# Patient Record
Sex: Male | Born: 1935 | Race: White | Hispanic: No | Smoking: Never smoker
Health system: Southern US, Community
[De-identification: ages and names within clinical notes are randomized; demographics above are authoritative.]

## PROBLEM LIST (undated history)

## (undated) DIAGNOSIS — I2699 Other pulmonary embolism without acute cor pulmonale: Secondary | ICD-10-CM

## (undated) DIAGNOSIS — F32A Depression, unspecified: Secondary | ICD-10-CM

## (undated) DIAGNOSIS — E039 Hypothyroidism, unspecified: Secondary | ICD-10-CM

## (undated) DIAGNOSIS — I6529 Occlusion and stenosis of unspecified carotid artery: Secondary | ICD-10-CM

## (undated) DIAGNOSIS — F329 Major depressive disorder, single episode, unspecified: Secondary | ICD-10-CM

## (undated) DIAGNOSIS — Z951 Presence of aortocoronary bypass graft: Secondary | ICD-10-CM

## (undated) DIAGNOSIS — K2970 Gastritis, unspecified, without bleeding: Secondary | ICD-10-CM

## (undated) DIAGNOSIS — E119 Type 2 diabetes mellitus without complications: Secondary | ICD-10-CM

## (undated) DIAGNOSIS — I4891 Unspecified atrial fibrillation: Secondary | ICD-10-CM

## (undated) DIAGNOSIS — E871 Hypo-osmolality and hyponatremia: Secondary | ICD-10-CM

## (undated) DIAGNOSIS — I739 Peripheral vascular disease, unspecified: Secondary | ICD-10-CM

## (undated) DIAGNOSIS — D649 Anemia, unspecified: Secondary | ICD-10-CM

## (undated) DIAGNOSIS — I10 Essential (primary) hypertension: Secondary | ICD-10-CM

---

## 2013-02-23 ENCOUNTER — Encounter (HOSPITAL_COMMUNITY): Payer: Self-pay | Admitting: Physical Medicine and Rehabilitation

## 2013-02-23 ENCOUNTER — Emergency Department (HOSPITAL_COMMUNITY): Payer: Medicare Other

## 2013-02-23 ENCOUNTER — Inpatient Hospital Stay (HOSPITAL_COMMUNITY)
Admission: EM | Admit: 2013-02-23 | Discharge: 2013-03-01 | DRG: 293 | Disposition: A | Discharge status: Hospice | Payer: Medicare Other | Attending: Internal Medicine | Admitting: Internal Medicine

## 2013-02-23 DIAGNOSIS — I2510 Atherosclerotic heart disease of native coronary artery without angina pectoris: Secondary | ICD-10-CM | POA: Diagnosis present

## 2013-02-23 DIAGNOSIS — E039 Hypothyroidism, unspecified: Secondary | ICD-10-CM | POA: Diagnosis present

## 2013-02-23 DIAGNOSIS — Z86711 Personal history of pulmonary embolism: Secondary | ICD-10-CM

## 2013-02-23 DIAGNOSIS — N189 Chronic kidney disease, unspecified: Secondary | ICD-10-CM

## 2013-02-23 DIAGNOSIS — R579 Shock, unspecified: Secondary | ICD-10-CM | POA: Diagnosis present

## 2013-02-23 DIAGNOSIS — E119 Type 2 diabetes mellitus without complications: Secondary | ICD-10-CM | POA: Diagnosis present

## 2013-02-23 DIAGNOSIS — I4891 Unspecified atrial fibrillation: Secondary | ICD-10-CM | POA: Diagnosis present

## 2013-02-23 DIAGNOSIS — I959 Hypotension, unspecified: Secondary | ICD-10-CM

## 2013-02-23 DIAGNOSIS — R7989 Other specified abnormal findings of blood chemistry: Secondary | ICD-10-CM

## 2013-02-23 DIAGNOSIS — Z951 Presence of aortocoronary bypass graft: Secondary | ICD-10-CM

## 2013-02-23 DIAGNOSIS — I509 Heart failure, unspecified: Principal | ICD-10-CM | POA: Diagnosis present

## 2013-02-23 DIAGNOSIS — D649 Anemia, unspecified: Secondary | ICD-10-CM | POA: Diagnosis present

## 2013-02-23 DIAGNOSIS — Z954 Presence of other heart-valve replacement: Secondary | ICD-10-CM

## 2013-02-23 HISTORY — DX: Depression, unspecified: F32.A

## 2013-02-23 HISTORY — DX: Hypo-osmolality and hyponatremia: E87.1

## 2013-02-23 HISTORY — DX: Major depressive disorder, single episode, unspecified: F32.9

## 2013-02-23 HISTORY — DX: Anemia, unspecified: D64.9

## 2013-02-23 HISTORY — DX: Presence of aortocoronary bypass graft: Z95.1

## 2013-02-23 HISTORY — DX: Essential (primary) hypertension: I10

## 2013-02-23 HISTORY — DX: Hypothyroidism, unspecified: E03.9

## 2013-02-23 HISTORY — DX: Unspecified atrial fibrillation: I48.91

## 2013-02-23 HISTORY — DX: Peripheral vascular disease, unspecified: I73.9

## 2013-02-23 HISTORY — DX: Gastritis, unspecified, without bleeding: K29.70

## 2013-02-23 HISTORY — DX: Type 2 diabetes mellitus without complications: E11.9

## 2013-02-23 HISTORY — DX: Occlusion and stenosis of unspecified carotid artery: I65.29

## 2013-02-23 HISTORY — DX: Other pulmonary embolism without acute cor pulmonale: I26.99

## 2013-02-23 LAB — CBC WITH DIFFERENTIAL/PLATELET
Basophils Relative: 0 % (ref 0–1)
Eosinophils Absolute: 0.1 10*3/uL (ref 0.0–0.7)
HCT: 35.5 % — ABNORMAL LOW (ref 39.0–52.0)
Hemoglobin: 11.9 g/dL — ABNORMAL LOW (ref 13.0–17.0)
MCH: 30.1 pg (ref 26.0–34.0)
MCHC: 33.5 g/dL (ref 30.0–36.0)
MCV: 89.9 fL (ref 78.0–100.0)
Monocytes Absolute: 1.2 10*3/uL — ABNORMAL HIGH (ref 0.1–1.0)
Monocytes Relative: 20 % — ABNORMAL HIGH (ref 3–12)

## 2013-02-23 LAB — COMPREHENSIVE METABOLIC PANEL
ALT: 58 U/L — ABNORMAL HIGH (ref 0–53)
CO2: 24 mEq/L (ref 19–32)
Calcium: 8.6 mg/dL (ref 8.4–10.5)
Creatinine, Ser: 3.76 mg/dL — ABNORMAL HIGH (ref 0.50–1.35)
GFR calc Af Amer: 15 mL/min — ABNORMAL LOW (ref >90)
GFR calc non Af Amer: 13 mL/min — ABNORMAL LOW (ref >90)
Glucose, Bld: 76 mg/dL (ref 70–99)
Sodium: 134 mEq/L — ABNORMAL LOW (ref 135–145)

## 2013-02-23 LAB — LACTIC ACID, PLASMA: Lactic Acid, Venous: 1.1 mmol/L (ref 0.5–2.2)

## 2013-02-23 LAB — POCT I-STAT 3, ART BLOOD GAS (G3+)
Acid-base deficit: 2 mmol/L (ref 0.0–2.0)
Patient temperature: 98.1
TCO2: 24 mmol/L (ref 0–100)
pH, Arterial: 7.378 (ref 7.350–7.450)

## 2013-02-23 MED ORDER — LEVOTHYROXINE SODIUM 112 MCG PO TABS
112.0000 ug | ORAL_TABLET | Freq: Every day | ORAL | Status: DC
  Administered 2013-02-26 – 2013-03-01 (×4): 112 ug via ORAL
  Filled 2013-02-23 (×8): qty 1

## 2013-02-23 MED ORDER — SODIUM CHLORIDE 0.9 % IV SOLN
250.0000 mL | INTRAVENOUS | Status: DC | PRN
  Administered 2013-02-24: 250 mL via INTRAVENOUS

## 2013-02-23 MED ORDER — NEBIVOLOL HCL 5 MG PO TABS: 5.0000 mg | ORAL_TABLET | Freq: Every morning | ORAL | Status: DC

## 2013-02-23 MED ORDER — ASPIRIN EC 81 MG PO TBEC
81.0000 mg | DELAYED_RELEASE_TABLET | Freq: Every morning | ORAL | Status: DC
  Administered 2013-02-24 – 2013-03-01 (×6): 81 mg via ORAL
  Filled 2013-02-23 (×6): qty 1

## 2013-02-23 MED ORDER — SODIUM CHLORIDE 0.9 % IV SOLN
Freq: Once | INTRAVENOUS | Status: AC
  Administered 2013-02-23: via INTRAVENOUS

## 2013-02-23 MED ORDER — DOCUSATE SODIUM 100 MG PO CAPS
100.0000 mg | ORAL_CAPSULE | Freq: Two times a day (BID) | ORAL | Status: DC
  Administered 2013-02-24 – 2013-03-01 (×9): 100 mg via ORAL
  Filled 2013-02-23 (×11): qty 1

## 2013-02-23 MED ORDER — RANOLAZINE ER 500 MG PO TB12
500.0000 mg | ORAL_TABLET | Freq: Two times a day (BID) | ORAL | Status: DC
  Administered 2013-02-24 – 2013-03-01 (×8): 500 mg via ORAL
  Filled 2013-02-23 (×12): qty 1

## 2013-02-23 MED ORDER — NITROGLYCERIN 0.4 MG SL SUBL: 0.4000 mg | SUBLINGUAL_TABLET | SUBLINGUAL | Status: DC | PRN

## 2013-02-23 MED ORDER — FAMOTIDINE 20 MG PO TABS
20.0000 mg | ORAL_TABLET | Freq: Every morning | ORAL | Status: DC
  Administered 2013-02-24 – 2013-03-01 (×6): 20 mg via ORAL
  Filled 2013-02-23 (×8): qty 1

## 2013-02-23 MED ORDER — FOLIC ACID 1 MG PO TABS
2.0000 mg | ORAL_TABLET | Freq: Every morning | ORAL | Status: DC
  Administered 2013-02-24 – 2013-03-01 (×6): 2 mg via ORAL
  Filled 2013-02-23 (×6): qty 2

## 2013-02-23 MED ORDER — SODIUM CHLORIDE 0.9 % IV BOLUS (SEPSIS): 1000.0000 mL | Freq: Once | INTRAVENOUS | Status: AC

## 2013-02-23 MED ORDER — SODIUM CHLORIDE 0.9 % IV BOLUS (SEPSIS)
1000.0000 mL | Freq: Once | INTRAVENOUS | Status: AC
  Administered 2013-02-23 (×2): 1000 mL via INTRAVENOUS

## 2013-02-23 MED ORDER — ATORVASTATIN CALCIUM 80 MG PO TABS
80.0000 mg | ORAL_TABLET | Freq: Every evening | ORAL | Status: DC
  Administered 2013-02-24: 80 mg via ORAL
  Filled 2013-02-23 (×2): qty 1

## 2013-02-23 MED ORDER — FUROSEMIDE 10 MG/ML IJ SOLN: 40.0000 mg | Freq: Once | INTRAMUSCULAR | Status: DC

## 2013-02-23 NOTE — ED Notes (Signed)
Pt is alert and oriented to self.Family at bedside. Made EDP aware that pt Blood pressure has been in 80s and oxygen saturations have not been above 90

## 2013-02-23 NOTE — ED Notes (Signed)
Admitting at bedside 

## 2013-02-23 NOTE — ED Notes (Addendum)
Pt presents to department via [facility] from [facility] for evaluation of fall and headache. Pt had unwitnessed fall yesterday, tripped over laundry basket. Takes coumadin for afib. Facility staff noticed that he was "mumbling" earlier today. Upon arrival pt is alert and able to answer questions appropriately. 10/10 headache at the time, describes as "worse headache of his life." pinpoint pupils noted. 18g RAC. BP 80/40. CBG 119.

## 2013-02-23 NOTE — ED Notes (Signed)
Report given to [facility] 2300. RN had No questions or concerns at this time.

## 2013-02-23 NOTE — ED Notes (Signed)
Family Contact Information  Dylan Salinas [phone]) [phone] [facility]) [phone](Work) Follow Prompts  Dylan Salinas (Daughter)  [phone] (Cell) [phone] (Home)

## 2013-02-23 NOTE — Consult Note (Signed)
Reason for Consult: Abnormal troponin-I  Referring Physician: Dr. Manus Gunning  Primary Cardiologist: Dr. Retta Mac - [place]  Dylan Salinas is an 79 y.o. male.   HPI: 79 y/o male currently been seen in consultation with Dr. Manus Gunning for evaluation of abnormal troponin-I. Patient is somnolent and unable to give history. Thus, history was obtained from available family members. Patient has a PMH of CAD s/p single vessel CABG and Mechanical Aortic valve replacement ([employer]) in 1988 at [facility] (record not available). About 4 years ago, he has chest discomfort and cardiac cath performed in [facility] showed that his graft vessel was occluded and a stent was placed in the native artery that the graft vessel was previously supplying. Patient has been on Coumadin for anti-coagulation for mechanical Aortic vale and AFIB.    He presented to [facility] with complaints of headache s/p fall yesterday.  As part of his evaluation, troponin-I was obtained and was found to be 1.0 ng/ml. Patient denies any chest pain, diaphoresis, PND, orthopnea or palpitation. His systolic blood pressure is 88 mmHg, and his heart rate is 53 bpm on telemetry, and he is hypoxic.  His EKG showed AFIB (56 bpm), with RBBB and LAFB.  His serum creatinine is 3.7 and his INR is 3.9.  A CT of the head did not show any acute intracranial process.  Past Medical History  Diagnosis Date  . Atrial fibrillation   . Diabetes mellitus without complication   . Hypertension   . Anemia   . Depression   . PVD (peripheral vascular disease)   . Carotid stenosis   . Gastritis   . Hypothyroidism   . Hyponatremia   . Hx of CABG   . PE (pulmonary embolism)     History reviewed. No pertinent past surgical history.  History reviewed. No pertinent family history.  Social History:  reports that he has never smoked. He does not have any smokeless tobacco history on file. He reports that he does not drink alcohol or use illicit  drugs. Patient Lives in [facility] assisted living facility  Allergies:  Allergies  Allergen Reactions  . Cephalosporins     Noted on MAR  . Cinchona Extract     Noted on MAR  . Macrolides And Ketolides     Noted on MAR  . Methenamine     Noted on MAR  . Quinidine     Noted on MAR  . Salicylates     Noted on MAR    Medications:   1. Aspirin 81 mg qd 2. Lipitor 80 mg qhs 3. Bystolic 5 mg qd 4. Famotidine 20 mg qd 5. Folic Acid 1 mg qd 6. Glipizide 5 mg qd 7. Imdur 30 mg qd 8. Levothyroxine 112 microgram qd 9. Lisinopril 10 mg bid 10. Torsemide 40 mg qd 11. Zaroxolyn 5 mg qd 12. Ranexa ER 500 mg bid 13. Coumadin 2 mg qd alternating with 1 mg every 3rd day  Results for orders placed during the hospital encounter of 02/23/13 (from the past 48 hour(s))  TROPONIN I     Status: Abnormal   Collection Time    02/23/13  3:39 PM      Result Value Range   Troponin I 1.01 (*) <0.30 ng/mL   Comment:            Due to the release kinetics of cTnI,     a negative result within the first hours     of the onset of  symptoms does not rule out     myocardial infarction with certainty.     If myocardial infarction is still suspected,     repeat the test at appropriate intervals.     CRITICAL RESULT CALLED TO, READ BACK BY AND VERIFIED WITH:     [facility] RN 02/23/13 1724 WOOTEN,K  PRO B NATRIURETIC PEPTIDE     Status: Abnormal   Collection Time    02/23/13  3:39 PM      Result Value Range   Pro B Natriuretic peptide (BNP) 10881.0 (*) 0 - 450 pg/mL  CBC WITH DIFFERENTIAL     Status: Abnormal   Collection Time    02/23/13  3:44 PM      Result Value Range   WBC 5.8  4.0 - 10.5 K/uL   RBC 3.95 (*) 4.22 - 5.81 MIL/uL   Hemoglobin 11.9 (*) 13.0 - 17.0 g/dL   HCT 16.1 (*) 09.6 - 04.5 %   MCV 89.9  78.0 - 100.0 fL   MCH 30.1  26.0 - 34.0 pg   MCHC 33.5  30.0 - 36.0 g/dL   RDW 40.9 (*) 81.1 - 91.4 %   Platelets 151  150 - 400 K/uL   Neutrophils Relative 62  43 - 77 %   Neutro  Abs 3.6  1.7 - 7.7 K/uL   Lymphocytes Relative 16  12 - 46 %   Lymphs Abs 0.9  0.7 - 4.0 K/uL   Monocytes Relative 20 (*) 3 - 12 %   Monocytes Absolute 1.2 (*) 0.1 - 1.0 K/uL   Eosinophils Relative 2  0 - 5 %   Eosinophils Absolute 0.1  0.0 - 0.7 K/uL   Basophils Relative 0  0 - 1 %   Basophils Absolute 0.0  0.0 - 0.1 K/uL  PROTIME-INR     Status: Abnormal   Collection Time    02/23/13  3:44 PM      Result Value Range   Prothrombin Time 36.0 (*) 11.6 - 15.2 seconds   INR 3.91 (*) 0.00 - 1.49  COMPREHENSIVE METABOLIC PANEL     Status: Abnormal   Collection Time    02/23/13  3:44 PM      Result Value Range   Sodium 134 (*) 135 - 145 mEq/L   Potassium 4.3  3.5 - 5.1 mEq/L   Chloride 95 (*) 96 - 112 mEq/L   CO2 24  19 - 32 mEq/L   Glucose, Bld 76  70 - 99 mg/dL   BUN 88 (*) 6 - 23 mg/dL   Creatinine, Ser 7.82 (*) 0.50 - 1.35 mg/dL   Calcium 8.6  8.4 - 95.6 mg/dL   Total Protein 6.4  6.0 - 8.3 g/dL   Albumin 2.6 (*) 3.5 - 5.2 g/dL   AST 213 (*) 0 - 37 U/L   ALT 58 (*) 0 - 53 U/L   Alkaline Phosphatase 124 (*) 39 - 117 U/L   Total Bilirubin 2.6 (*) 0.3 - 1.2 mg/dL   GFR calc non Af Amer 13 (*) >90 mL/min   GFR calc Af Amer 15 (*) >90 mL/min   Comment:            The eGFR has been calculated     using the CKD EPI equation.     This calculation has not been     validated in all clinical     situations.     eGFR's persistently     <90 mL/min signify  possible Chronic Kidney Disease.  POCT I-STAT 3, BLOOD GAS (G3+)     Status: Abnormal   Collection Time    02/23/13  5:24 PM      Result Value Range   pH, Arterial 7.378  7.350 - 7.450   pCO2 arterial 38.9  35.0 - 45.0 mmHg   pO2, Arterial 49.0 (*) 80.0 - 100.0 mmHg   Bicarbonate 23.0  20.0 - 24.0 mEq/L   TCO2 24  0 - 100 mmol/L   O2 Saturation 84.0     Acid-base deficit 2.0  0.0 - 2.0 mmol/L   Patient temperature 98.1 F     Collection site RADIAL, ALLEN'S TEST ACCEPTABLE     Drawn by Operator     Sample type ARTERIAL       Dg Chest 2 View  02/23/2013  *RADIOLOGY REPORT*  Clinical Data: Hypoxia  CHEST - 2 VIEW  Comparison: None.  Findings: Lungs are essentially clear.  No focal consolidation. No pleural effusion or pneumothorax.  Nodular opacities overlying the bilateral lower lobes are favored to reflect chest leads.  Cardiomegaly.  Postsurgical changes related to prior CABG.  Mild degenerative changes of the visualized thoracolumbar spine.  IMPRESSION: No evidence of acute cardiopulmonary disease.   Original Report Authenticated By: Charline Bills, M.D.    Ct Head Wo Contrast  02/23/2013  *RADIOLOGY REPORT*  Clinical Data: Fall.  On Coumadin.  Headache.  CT HEAD WITHOUT CONTRAST  Technique:  Contiguous axial images were obtained from the base of the skull through the vertex without contrast.  Comparison: None  Findings: Moderate atrophy.  Chronic microvascular ischemic change in the white matter.  No acute infarct.  No intracranial hemorrhage or mass.  Negative for skull fracture.  Multiple gas bubbles are present in the soft tissues of the infratemporal fossa and in the soft tissues of the right scalp.  No obvious laceration is seen.  This may be venous air from IV insertion.  Does the patient have a right arm IV? There is calcification in the scalp bilaterally which may be arterial calcification.  IMPRESSION: Atrophy and chronic microvascular ischemia.  No acute intracranial abnormality.  Multiple gas bubbles in the soft tissues on the right, most likely introduced from a right arm IV.   Original Report Authenticated By: Janeece Riggers, M.D.     Review of Systems  Unable to perform ROS: mental acuity   Blood pressure 88/53, pulse 57, temperature 98.1 F (36.7 C), temperature source Oral, resp. rate 18, SpO2 87.00%. Physical Exam  Constitutional: He appears well-developed and well-nourished. No distress.  Somnolent but easily arousable  HENT:  Head: Normocephalic and atraumatic.  Eyes: EOM are normal. Right eye  exhibits no discharge. Left eye exhibits no discharge. No scleral icterus.  Pin point pupil bilaterally  Neck: Normal range of motion. Neck supple. No JVD present. No thyromegaly present.  Cardiovascular: Exam reveals no friction rub.   No murmur heard. Irregularly irregular rhythm with crisp click of metallic prosthetic aortic valve  Respiratory: No stridor. No respiratory distress. He has no wheezes. He has no rales.  GI: Soft. Bowel sounds are normal. He exhibits distension. There is no tenderness. There is no rebound.  Musculoskeletal: He exhibits no edema and no tenderness.  Neurological:  Somnolent but easily arousable  Skin: No rash noted. He is not diaphoretic. No erythema. No pallor.    Assessment/Plan:  1. Elevated Troponin-I 2. CAD s/p CABG x 1 s/p PCI about 4 years ago 3. Mechanical Aortic Valve (  st Jude) 4. AFIB with normal ventricular rate 5. Chronic Kidney Disease (stage 5, GFR 13) 6. NIDDM  Patient has elevated troponin-I, but it is not clear if his troponin are rising at this point in time.  He has CKD stage 5, and he is unable to clear troponin. Currently, his baseline troponin is unknown.  He currently does not have chest pain, and there is no definite ST-elevation on his EKG.  Elevated troponin could be due to demand ischemia from the stress of his current illness and relatively low blood pressure.  Given his co-morbidities, CKD, we will recommend medical therapy only.  He is already on Aspirin, beta-blockers and Lipitor.  We will recommend obtaining serial cardiac markers to see which way they are trending and to obtain a transthoracic echocardiogram in the morning to evaluate his systolic and diastolic function.  Since his blood pressure is on the low side, he may benefit from holding the evening dose of his Lisinopril to see if his blood pressure improves.  In addition, since he has supra-therapeutic INR and he is having headache s/p fall, we recommend holding his  Coumadin tonight and obtaining PT/INR tomorrow. He may also benefit from Holding Torsemide and Zaroxolyn since he has worsening renal function.  AITSEBAOMO, Dylan Salinas 02/23/2013, 7:43 PM

## 2013-02-23 NOTE — ED Notes (Signed)
Call [facility] to determine when they coming to assess and admit pt. Made EDP and family aware of their response. Will continue to monitor.

## 2013-02-23 NOTE — ED Provider Notes (Signed)
History     CSN: [id number]  Arrival date & time 02/23/13  1523   First MD Initiated Contact with Patient 02/23/13 1529      Chief Complaint  Patient presents with  . Fall  . Headache    (Consider location/radiation/quality/duration/timing/severity/associated sxs/prior treatment) HPI Comments: Patient presents via EMS with altered mental status for the past day. He apparently had an unwitnessed fall yesterday. He denies falling. He complains of a headache that started about 3 hours ago. Is diffuse in the top of his head is going to the back. He is somnolent but protecting his airway and answers questions appropriately. He is oriented x3. He is found to be hypotensive and hypoxic. He denies any chest pain or difficulty breathing. He's on Coumadin for history of A. Fib, PE, mechanical aortic valve.  The history is provided by the patient and the EMS personnel. The history is limited by the condition of the patient.    Past Medical History  Diagnosis Date  . Atrial fibrillation   . Diabetes mellitus without complication   . Hypertension   . Anemia   . Depression   . PVD (peripheral vascular disease)   . Carotid stenosis   . Gastritis   . Hypothyroidism   . Hyponatremia   . Hx of CABG   . PE (pulmonary embolism)     History reviewed. No pertinent past surgical history.  History reviewed. No pertinent family history.  History  Substance Use Topics  . Smoking status: Never Smoker   . Smokeless tobacco: Not on file  . Alcohol Use: No      Review of Systems  Unable to perform ROS: Mental status change  Neurological: Positive for headaches.    Allergies  Cephalosporins; Cinchona extract; Macrolides and ketolides; Methenamine; Quinidine; and Salicylates  Home Medications   No current outpatient prescriptions on file.  BP 109/54  Pulse 68  Temp(Src) 96.8 F (36 C) (Core (Comment))  Resp 18  Ht 5\' 8"  (1.727 m)  Wt 187 lb 6.3 oz (85 kg)  BMI 28.5 kg/m2  SpO2  95%  Physical Exam  Constitutional: He is oriented to person, place, and time. He appears well-developed and well-nourished. No distress.  Somnolent, but arousable, protecting airway, answers questions appropriately  HENT:  Head: Normocephalic and atraumatic.  Mouth/Throat: Oropharynx is clear and moist. No oropharyngeal exudate.  Eyes: Conjunctivae and EOM are normal. Pupils are equal, round, and reactive to light.  Pinpoint pupils bilaterally  Neck: Normal range of motion. Neck supple.  Cardiovascular: Normal rate and normal heart sounds.   No murmur heard. Irregular bradycardic rhythm  Pulmonary/Chest: Effort normal and breath sounds normal. No respiratory distress.  Bibasilar crackles  Abdominal: Soft. There is no tenderness. There is no rebound and no guarding.  Musculoskeletal: Normal range of motion. He exhibits no edema and no tenderness.  Neurological: He is alert and oriented to person, place, and time. No cranial nerve deficit. He exhibits normal muscle tone. Coordination normal.   Equal grip strength bilaterally, moving all extremities  Skin: Skin is warm.    ED Course  Procedures (including critical care time)  Labs Reviewed  CBC WITH DIFFERENTIAL - Abnormal; Notable for the following:    RBC 3.95 (*)    Hemoglobin 11.9 (*)    HCT 35.5 (*)    RDW 17.6 (*)    Monocytes Relative 20 (*)    Monocytes Absolute 1.2 (*)    All other components within normal limits  PROTIME-INR -  Abnormal; Notable for the following:    Prothrombin Time 36.0 (*)    INR 3.91 (*)    All other components within normal limits  COMPREHENSIVE METABOLIC PANEL - Abnormal; Notable for the following:    Sodium 134 (*)    Chloride 95 (*)    BUN 88 (*)    Creatinine, Ser 3.76 (*)    Albumin 2.6 (*)    AST 117 (*)    ALT 58 (*)    Alkaline Phosphatase 124 (*)    Total Bilirubin 2.6 (*)    GFR calc non Af Amer 13 (*)    GFR calc Af Amer 15 (*)    All other components within normal limits   TROPONIN I - Abnormal; Notable for the following:    Troponin I 1.01 (*)    All other components within normal limits  PRO B NATRIURETIC PEPTIDE - Abnormal; Notable for the following:    Pro B Natriuretic peptide (BNP) 10881.0 (*)    All other components within normal limits  CBC - Abnormal; Notable for the following:    RDW 17.6 (*)    All other components within normal limits  BASIC METABOLIC PANEL - Abnormal; Notable for the following:    Glucose, Bld 45 (*)    BUN 90 (*)    Creatinine, Ser 3.86 (*)    GFR calc non Af Amer 13 (*)    GFR calc Af Amer 14 (*)    All other components within normal limits  MAGNESIUM - Abnormal; Notable for the following:    Magnesium 2.6 (*)    All other components within normal limits  PROTIME-INR - Abnormal; Notable for the following:    Prothrombin Time 39.4 (*)    INR 4.42 (*)    All other components within normal limits  TROPONIN I - Abnormal; Notable for the following:    Troponin I 1.11 (*)    All other components within normal limits  TROPONIN I - Abnormal; Notable for the following:    Troponin I 1.81 (*)    All other components within normal limits  GLUCOSE, CAPILLARY - Abnormal; Notable for the following:    Glucose-Capillary 43 (*)    All other components within normal limits  GLUCOSE, CAPILLARY - Abnormal; Notable for the following:    Glucose-Capillary 60 (*)    All other components within normal limits  GLUCOSE, CAPILLARY - Abnormal; Notable for the following:    Glucose-Capillary 109 (*)    All other components within normal limits  POCT I-STAT 3, BLOOD GAS (G3+) - Abnormal; Notable for the following:    pO2, Arterial 49.0 (*)    All other components within normal limits  POCT I-STAT 3, BLOOD GAS (G3+) - Abnormal; Notable for the following:    pO2, Arterial 76.0 (*)    Acid-base deficit 3.0 (*)    All other components within normal limits  MRSA PCR SCREENING  MRSA PCR SCREENING  LACTIC ACID, PLASMA  GLUCOSE, CAPILLARY   CARBOXYHEMOGLOBIN  PROCALCITONIN  GLUCOSE, CAPILLARY  BLOOD GAS, ARTERIAL  HEMOGLOBIN A1C  TROPONIN I  CORTISOL   Dg Chest 2 View  02/23/2013  *RADIOLOGY REPORT*  Clinical Data: Hypoxia  CHEST - 2 VIEW  Comparison: None.  Findings: Lungs are essentially clear.  No focal consolidation. No pleural effusion or pneumothorax.  Nodular opacities overlying the bilateral lower lobes are favored to reflect chest leads.  Cardiomegaly.  Postsurgical changes related to prior CABG.  Mild degenerative changes of  the visualized thoracolumbar spine.  IMPRESSION: No evidence of acute cardiopulmonary disease.   Original Report Authenticated By: Charline Bills, M.D.    Ct Head Wo Contrast  02/23/2013  *RADIOLOGY REPORT*  Clinical Data: Fall.  On Coumadin.  Headache.  CT HEAD WITHOUT CONTRAST  Technique:  Contiguous axial images were obtained from the base of the skull through the vertex without contrast.  Comparison: None  Findings: Moderate atrophy.  Chronic microvascular ischemic change in the white matter.  No acute infarct.  No intracranial hemorrhage or mass.  Negative for skull fracture.  Multiple gas bubbles are present in the soft tissues of the infratemporal fossa and in the soft tissues of the right scalp.  No obvious laceration is seen.  This may be venous air from IV insertion.  Does the patient have a right arm IV? There is calcification in the scalp bilaterally which may be arterial calcification.  IMPRESSION: Atrophy and chronic microvascular ischemia.  No acute intracranial abnormality.  Multiple gas bubbles in the soft tissues on the right, most likely introduced from a right arm IV.   Original Report Authenticated By: Janeece Riggers, M.D.    Dg Chest Port 1 View  02/24/2013  *RADIOLOGY REPORT*  Clinical Data: Central line placement.  PORTABLE CHEST - 1 VIEW  Comparison: Chest radiograph performed the 2014  Findings: The patient's right IJ line appears to end within the right atrium; this should be  retracted 3-4 cm to the cavoatrial junction.  Mild left basilar opacity may reflect atelectasis or possibly pneumonia.  No pleural effusion or pneumothorax is seen.  The lungs are relatively well expanded.  Scattered calcified granulomata are suspected.  The cardiomediastinal silhouette is mildly enlarged.  The patient is status post median sternotomy, with evidence of prior CABG. Calcification is noted in the aortic arch.  No acute osseous abnormalities are seen.  IMPRESSION:  1.  Right IJ line appears to end within the right atrium; this should be retracted 3-4 cm to the cavoatrial junction. 2.  Mild left basilar airspace opacity may reflect atelectasis or possibly pneumonia. 3.  Mild cardiomegaly.  These results were called by telephone on 02/24/2013 at 01:58 a.m. to [facility] on [id number], who verbally acknowledged these results.   Original Report Authenticated By: Tonia Ghent, M.D.      1. Headache   2. NSTEMI (non-ST elevated myocardial infarction)   3. Acute on chronic renal failure   4. Shock circulatory   5. Hypotension       MDM  Patient complains of headache that onset around 12 PM today. He had an unwitnessed fall yesterday falling over a laundry basket. At his facility today he was noted to be mumbling in complaining of a headache that onset suddenly. He is on Coumadin for history of atrial fibrillation, PE a mechanical aortic valve.  Hypotensive and hypoxic on arrival.  Bibasilar crackles. No peripheral edema.  Will gently hydrate.  Troponin elevated at 1. Patient denies any chest pain or shortness of breath. EKG shows slow A. fib. Discussed with on-call cardiologist. Patient's sees a cardiologist in [facility] has had a bypass in the past with multiple stents. Cardiology will consult. Medical management planned.  Interstitial edema on CXR.  Patient requiring NRB to maintain oxygen saturations. No improvement in BP with IVF.  BNP elevated. Comfortable on NRB. Hold diuresis in  setting of hypotension.  CT negative for hemorrhage.  Patient states headache was sudden onset.  Unable to perform LP given poor vital signs  and elevated INR.  PCCM to admit.  D/w patient and family who desire full code.    Date: 02/23/2013  Rate: 56  Rhythm: atrial fibrillation  QRS Axis: normal  Intervals: normal  ST/T Wave abnormalities: nonspecific ST/T changes  Conduction Disutrbances:right bundle branch block and left anterior fascicular block  Narrative Interpretation:   Old EKG Reviewed: none available  CRITICAL CARE Performed by: Glynn Octave   Total critical care time: 45  Critical care time was exclusive of separately billable procedures and treating other patients.  Critical care was necessary to treat or prevent imminent or life-threatening deterioration.  Critical care was time spent personally by me on the following activities: development of treatment plan with patient and/or surrogate as well as nursing, discussions with consultants, evaluation of patient's response to treatment, examination of patient, obtaining history from patient or surrogate, ordering and performing treatments and interventions, ordering and review of laboratory studies, ordering and review of radiographic studies, pulse oximetry and re-evaluation of patient's condition.   Glynn Octave, MD 02/24/13 1230

## 2013-02-24 ENCOUNTER — Inpatient Hospital Stay (HOSPITAL_COMMUNITY): Payer: Medicare Other

## 2013-02-24 DIAGNOSIS — R51 Headache: Secondary | ICD-10-CM

## 2013-02-24 DIAGNOSIS — R579 Shock, unspecified: Secondary | ICD-10-CM | POA: Diagnosis present

## 2013-02-24 LAB — BASIC METABOLIC PANEL
GFR calc Af Amer: 14 mL/min — ABNORMAL LOW (ref >90)
GFR calc non Af Amer: 13 mL/min — ABNORMAL LOW (ref >90)
Glucose, Bld: 45 mg/dL — ABNORMAL LOW (ref 70–99)
Potassium: 4.3 mEq/L (ref 3.5–5.1)
Sodium: 135 mEq/L (ref 135–145)

## 2013-02-24 LAB — GLUCOSE, CAPILLARY
Glucose-Capillary: 90 mg/dL (ref 70–99)
Glucose-Capillary: 109 mg/dL — ABNORMAL HIGH (ref 70–99)
Glucose-Capillary: 60 mg/dL — ABNORMAL LOW (ref 70–99)
Glucose-Capillary: 73 mg/dL (ref 70–99)

## 2013-02-24 LAB — POCT I-STAT 3, ART BLOOD GAS (G3+)
Acid-base deficit: 3 mmol/L — ABNORMAL HIGH (ref 0.0–2.0)
Bicarbonate: 22.9 mEq/L (ref 20.0–24.0)
O2 Saturation: 96 %
Patient temperature: 94.9
TCO2: 24 mmol/L (ref 0–100)
pCO2 arterial: 38.6 mmHg (ref 35.0–45.0)
pH, Arterial: 7.372 (ref 7.350–7.450)
pO2, Arterial: 76 mmHg — ABNORMAL LOW (ref 80.0–100.0)

## 2013-02-24 LAB — PROCALCITONIN: Procalcitonin: 0.32 ng/mL

## 2013-02-24 LAB — CBC
Hemoglobin: 13.8 g/dL (ref 13.0–17.0)
MCHC: 34.4 g/dL (ref 30.0–36.0)
WBC: 6.8 10*3/uL (ref 4.0–10.5)

## 2013-02-24 LAB — TROPONIN I
Troponin I: 1.81 ng/mL (ref <0.30)
Troponin I: 1.44 ng/mL (ref <0.30)

## 2013-02-24 LAB — PROTIME-INR
INR: 4.42 — ABNORMAL HIGH (ref 0.00–1.49)
Prothrombin Time: 39.4 seconds — ABNORMAL HIGH (ref 11.6–15.2)

## 2013-02-24 LAB — MRSA PCR SCREENING: MRSA by PCR: NEGATIVE

## 2013-02-24 LAB — CARBOXYHEMOGLOBIN: Carboxyhemoglobin: 1.2 % (ref 0.5–1.5)

## 2013-02-24 MED ORDER — DEXTROSE 50 % IV SOLN
25.0000 mL | Freq: Once | INTRAVENOUS | Status: AC | PRN
  Administered 2013-02-24: 25 mL via INTRAVENOUS

## 2013-02-24 MED ORDER — SODIUM CHLORIDE 0.9 % IJ SOLN: 10.0000 mL | INTRAMUSCULAR | Status: DC | PRN

## 2013-02-24 MED ORDER — NEBIVOLOL HCL 2.5 MG PO TABS
2.5000 mg | ORAL_TABLET | Freq: Every morning | ORAL | Status: DC
  Administered 2013-02-26 – 2013-03-01 (×4): 2.5 mg via ORAL
  Filled 2013-02-24 (×6): qty 1

## 2013-02-24 MED ORDER — DOPAMINE-DEXTROSE 3.2-5 MG/ML-% IV SOLN
2.0000 ug/kg/min | INTRAVENOUS | Status: DC
  Administered 2013-02-24: 4 ug/kg/min via INTRAVENOUS
  Administered 2013-02-24: 5 ug/kg/min via INTRAVENOUS
  Filled 2013-02-24 (×2): qty 250

## 2013-02-24 MED ORDER — DEXTROSE 50 % IV SOLN
25.0000 mL | Freq: Once | INTRAVENOUS | Status: AC
  Administered 2013-02-24: 25 mL via INTRAVENOUS

## 2013-02-24 MED ORDER — INSULIN ASPART 100 UNIT/ML ~~LOC~~ SOLN
0.0000 [IU] | SUBCUTANEOUS | Status: DC
  Administered 2013-02-25: 1 [IU] via SUBCUTANEOUS
  Administered 2013-02-25: 2 [IU] via SUBCUTANEOUS
  Administered 2013-02-25: 1 [IU] via SUBCUTANEOUS
  Administered 2013-02-25: 2 [IU] via SUBCUTANEOUS
  Administered 2013-02-25 – 2013-02-26 (×4): 1 [IU] via SUBCUTANEOUS
  Administered 2013-02-26 (×3): 2 [IU] via SUBCUTANEOUS
  Administered 2013-02-27 (×3): 1 [IU] via SUBCUTANEOUS
  Administered 2013-02-27: 2 [IU] via SUBCUTANEOUS
  Administered 2013-02-28 (×2): 1 [IU] via SUBCUTANEOUS
  Administered 2013-02-28: 2 [IU] via SUBCUTANEOUS
  Administered 2013-02-28: 1 [IU] via SUBCUTANEOUS

## 2013-02-24 MED ORDER — DEXTROSE 50 % IV SOLN
INTRAVENOUS | Status: AC
  Administered 2013-02-24: 50 mL
  Filled 2013-02-24: qty 50

## 2013-02-24 MED ORDER — DEXTROSE 50 % IV SOLN
INTRAVENOUS | Status: AC
  Filled 2013-02-24: qty 50

## 2013-02-24 MED ORDER — HYDROCORTISONE SOD SUCCINATE 100 MG IJ SOLR
50.0000 mg | Freq: Four times a day (QID) | INTRAMUSCULAR | Status: DC
  Administered 2013-02-24 – 2013-02-25 (×6): 50 mg via INTRAVENOUS
  Filled 2013-02-24 (×8): qty 1

## 2013-02-24 MED ORDER — CHLORHEXIDINE GLUCONATE 0.12 % MT SOLN
15.0000 mL | Freq: Two times a day (BID) | OROMUCOSAL | Status: DC
  Administered 2013-02-24 – 2013-02-27 (×6): 15 mL via OROMUCOSAL
  Filled 2013-02-24 (×6): qty 15

## 2013-02-24 MED ORDER — FUROSEMIDE 10 MG/ML IJ SOLN
120.0000 mg | Freq: Once | INTRAVENOUS | Status: AC
  Administered 2013-02-24: 120 mg via INTRAVENOUS
  Filled 2013-02-24: qty 12

## 2013-02-24 MED ORDER — SODIUM CHLORIDE 0.9 % IJ SOLN
10.0000 mL | Freq: Two times a day (BID) | INTRAMUSCULAR | Status: DC
  Administered 2013-02-24: 20 mL
  Administered 2013-02-24 – 2013-02-27 (×5): 10 mL

## 2013-02-24 MED ORDER — BIOTENE DRY MOUTH MT LIQD
15.0000 mL | Freq: Two times a day (BID) | OROMUCOSAL | Status: DC
  Administered 2013-02-24 – 2013-02-27 (×7): 15 mL via OROMUCOSAL

## 2013-02-24 NOTE — Progress Notes (Signed)
  Echocardiogram 2D Echocardiogram has been performed.  Dylan Salinas, Dylan Salinas 02/24/2013, 9:28 AM

## 2013-02-24 NOTE — Progress Notes (Signed)
Hypoglycemic Event  CBG 1149: 59    Treatment: 25mL IV D50  Symptoms: none  Follow-up CBG: Time 1220 CBG Result:80  Possible Reasons for Event:inadequate intake, patient NPO  Comments/MD notified hypoglycemia protocol initiated     Dylan Salinas  Remember to initiate Hypoglycemia Order Set & complete

## 2013-02-24 NOTE — Plan of Care (Signed)
Problem: Phase I Progression Outcomes Goal: OOB as tolerated unless otherwise ordered Outcome: Not Progressing Unable due to weakness and altered mental status    Goal: Voiding-avoid urinary catheter unless indicated Outcome: Not Progressing Altered mental status

## 2013-02-24 NOTE — Progress Notes (Signed)
Name: Dylan Salinas MRN: [id number] DOB: 1920-11-03    LOS: 1  Referring Provider:  Dr. Manus Gunning Reason for Referral:  Hypotension  PULMONARY / CRITICAL CARE MEDICINE  BRIEF: Dylan Salinas is a pleasant 79 year old man with past medical history of coronary artery disease, congestive heart failure, atrial fibrillation, aortic valve replacement, diabetes mellitus, and pulmonary embolism who was brought to the most common emergency room from his assisted living facility with increased fatigue and recent fall.  His son and daughter-in-law reports that for the last month to 6 weeks he has had progressive fatigue and weakness.  The day prior to admission he fell at his assisted living facility and they were concerned that because he is on Coumadin for his atrial fibrillation he might have hit his head and had a hemorrhage.  In ED he was found to be hypotensive and hypoxic, as well as having an elevated troponin and creatinine.  He was given 1 L normal saline with no significant improvement in his blood pressure.  [facility]. was called on 02/23/2013  for admission to treat his hypotension, hypoxia and multiple comorbidities.  Events Since Admission: 02/23/2013  3:23 PM > Admit. Central line placed, started on dopamine   SUBJECTIVE/OVERNIGHT/INTERVAL HX  02/24/2013 : Continues to be on dopamine 15 mcg per minute. CVP 17. He is awake alert calm  Vital Signs: Temp:  [94.9 F (34.9 C)-98.1 F (36.7 C)] 96.8 F (36 C) (03/09 0800) Pulse Rate:  [48-74] 69 (03/09 0900) Resp:  [12-23] 17 (03/09 0900) BP: (68-123)/(38-77) 123/61 mmHg (03/09 0900) SpO2:  [79 %-100 %] 98 % (03/09 0900) FiO2 (%):  [100 %] 100 % (03/09 0800) Weight:  [85 kg (187 lb 6.3 oz)] 85 kg (187 lb 6.3 oz) (03/08 2340)  Physical Examination: General:  Elderly male, laying in bed, no acute distress. Looks very deconditioned Neuro:  RASS sedation score 0. CAM-ICU negative for delirium  HEENT:  Pupils equally round reactive to light, extraocular  muscles intact OP clear Neck:  Supple, jugular venous distention to the earlobe at a 30 angle Cardiovascular: Heart rate 70 on dopamine, irregularly irregular, systolic click heard at left upper sternal border, 2/6 systolic murmur Lungs:  Bilateral basilar crackles, no wheezes, good air movement throughout Abdomen:  Distended, soft, nontender, positive bowel sounds Musculoskeletal:  No joint abnormalities, 1+ edema in bilateral lower extremities, clubbing Skin:  Skin changes consistent with chronic venous stasis in lower extremities, multiple ulcers on anterior lower extremities   IMAGING x 48h Dg Chest 2 View  02/23/2013  *RADIOLOGY REPORT*  Clinical Data: Hypoxia  CHEST - 2 VIEW  Comparison: None.  Findings: Lungs are essentially clear.  No focal consolidation. No pleural effusion or pneumothorax.  Nodular opacities overlying the bilateral lower lobes are favored to reflect chest leads.  Cardiomegaly.  Postsurgical changes related to prior CABG.  Mild degenerative changes of the visualized thoracolumbar spine.  IMPRESSION: No evidence of acute cardiopulmonary disease.   Original Report Authenticated By: Charline Bills, M.D.    Ct Head Wo Contrast  02/23/2013  *RADIOLOGY REPORT*  Clinical Data: Fall.  On Coumadin.  Headache.  CT HEAD WITHOUT CONTRAST  Technique:  Contiguous axial images were obtained from the base of the skull through the vertex without contrast.  Comparison: None  Findings: Moderate atrophy.  Chronic microvascular ischemic change in the white matter.  No acute infarct.  No intracranial hemorrhage or mass.  Negative for skull fracture.  Multiple gas bubbles are present in the soft tissues of  the infratemporal fossa and in the soft tissues of the right scalp.  No obvious laceration is seen.  This may be venous air from IV insertion.  Does the patient have a right arm IV? There is calcification in the scalp bilaterally which may be arterial calcification.  IMPRESSION: Atrophy and  chronic microvascular ischemia.  No acute intracranial abnormality.  Multiple gas bubbles in the soft tissues on the right, most likely introduced from a right arm IV.   Original Report Authenticated By: Janeece Riggers, M.D.    Dg Chest Port 1 View  02/24/2013  *RADIOLOGY REPORT*  Clinical Data: Central line placement.  PORTABLE CHEST - 1 VIEW  Comparison: Chest radiograph performed the 2014  Findings: The patient's right IJ line appears to end within the right atrium; this should be retracted 3-4 cm to the cavoatrial junction.  Mild left basilar opacity may reflect atelectasis or possibly pneumonia.  No pleural effusion or pneumothorax is seen.  The lungs are relatively well expanded.  Scattered calcified granulomata are suspected.  The cardiomediastinal silhouette is mildly enlarged.  The patient is status post median sternotomy, with evidence of prior CABG. Calcification is noted in the aortic arch.  No acute osseous abnormalities are seen.  IMPRESSION:  1.  Right IJ line appears to end within the right atrium; this should be retracted 3-4 cm to the cavoatrial junction. 2.  Mild left basilar airspace opacity may reflect atelectasis or possibly pneumonia. 3.  Mild cardiomegaly.  These results were called by telephone on 02/24/2013 at 01:58 a.m. to [facility] on [phone], who verbally acknowledged these results.   Original Report Authenticated By: Tonia Ghent, M.D.      Principal Problem:   Hypotension Active Problems:   Hypoxia   CHF (congestive heart failure)   CKD (chronic kidney disease)   ASSESSMENT AND PLAN  PULMONARY  Recent Labs Lab 02/23/13 1724 02/24/13 0014  PHART 7.378 7.372  PCO2ART 38.9 38.6  PO2ART 49.0* 76.0*  HCO3 23.0 22.9  O2SAT 84.0 96.0    A:  Hypoxic respiratory failure likely secondary to pulmonary edema from decompensated heart failure -  02/24/2013: He is on nasal cannula oxygen and maintaining normal work of breathing P:    nasal  oxygen    CARDIOVASCULAR  Recent Labs Lab 02/23/13 1539 02/23/13 1831 02/24/13 0515  TROPONINI 1.01*  --  1.11*  LATICACIDVEN  --  1.1  --   PROBNP 10881.0*  --   --    No results found for this basename: PROCALCITON,  in the last 168 hours  ECG:  Bradycardic, atrial fibrillation, wide QRS complex Lines: Right IJ placed 02/24/2013  A: Hypotension likely secondary to decompensated heart failure and bradycardia - 02/24/2013: CVP 17, BNP is 10,000 and troponins are mildly positive.  Maintaining vital signs on dopamine. Does a lot of conflicting data on etiology of shock. Differential diagnoses include sepsis, cardiac, adrenal insufficiency, medication induced due to antihypertensives. Currently appears well volume resuscitated   P:  - Maintain dopamine  for systolic greater than 90 and map greater than 55 - Check [facility] VO2   - Check echocardiogram - Check cortisol and start empiric hydrocortisone - Check pro-calcitonin algorithm    RENAL  Recent Labs Lab 02/23/13 1544 02/24/13 0426  NA 134* 135  K 4.3 4.3  CL 95* 97  CO2 24 23  BUN 88* 90*  CREATININE 3.76* 3.86*  CALCIUM 8.6 8.6  MG  --  2.6*   Intake/Output  03/08 0701 - 03/09 0700 03/09 0701 - 03/10 0700   I.V. (mL/kg) 1152 (13.6) 91.7 (1.1)   IV Piggyback 62    Total Intake(mL/kg) 1214 (14.3) 91.7 (1.1)   Urine (mL/kg/hr) 360 75 (0.3)   Total Output 360 75   Net +854 +16.7           A:  Likely acute on chronic renal failure baseline creatinine unknown  - 02/24/2013: Foley catheter is leaking. Creatinine is unchanged P:   DISCONTINUE FOLEY CATHETER  Follow daily BMP Follow urine output  GASTROINTESTINAL  Recent Labs Lab 02/23/13 1544  AST 117*  ALT 58*  ALKPHOS 124*  BILITOT 2.6*  PROT 6.4  ALBUMIN 2.6*    Recent Labs Lab 02/23/13 1544 02/24/13 0426  INR 3.91* 4.42*     A:  Liver disease/mild transaminitis. Reported history of recent diagnosis of hepatitis A not otherwise  specified  P:    obtain liver ultrasound; pending reportedly ordered by admitting physician  Consider hepatology consult  HEMATOLOGIC  Recent Labs Lab 02/23/13 1544 02/24/13 0426  HGB 11.9* 13.8  HCT 35.5* 40.1  PLT 151 159  INR 3.91* 4.42*   A:  Anticoagulated for history of atrial fibrillation and mechanical valve - 02/24/2013: INR 4.4 no evidence of obvious bleeding  P:  Supratherapeutic INR Hold Coumadin until INR less than 2.5 Depending on medical course at that point either start heparin GGT or restart Coumadin   INFECTIOUS  Recent Labs Lab 02/23/13 1544 02/24/13 0426  WBC 5.8 6.8  No results found for this basename: PROCALCITON,  in the last 168 hours  Cultures: None Antibiotics: None  A:  No obvious signs or source of infection P:   Monitor for signs of infectionbut we'll check pro-calcitonin algorithm   ENDOCRINE  Recent Labs Lab 02/24/13 0342 02/24/13 0418 02/24/13 0732  GLUCAP 43* 90 60*   A:  Diabetes   P:   Controlled on oral hypoglycemics Hold orals and place patient on sliding scale insulin  NEUROLOGIC  A:  Altered mental status. Head CT in ER at admission negative for acute processes   - 02/24/2013: He is awake and alert  P - Monitor     BEST PRACTICE / DISPOSITION Level of Care:  ICU Primary Service:  PC CM Consultants:  Cardiology Code Status:  Full Diet:  N.p.o. DVT Px:  Anticoagulated GI Px:  Pepcid Skin Integrity:  Lesions on anterior shins Social / Family:  NO ily at bedside  I spent of critical care time in the care of this patient appeared her procedures which are documented elsewhere    Dr. Kalman Shan, M.D., [facility].C.P Pulmonary and Critical Care Medicine Staff Physician [facility] [place] Pulmonary and Critical Care Pager: [phone], If no answer or between  15:00h - 7:00h: call [phone]  02/24/2013 10:24 AM

## 2013-02-24 NOTE — Progress Notes (Signed)
Hypoglycemic Event  CBG: 60  Treatment:D50 IV 25mL Symptoms: none  Follow-up CBG: Time 0800 CBG Result:109  Possible Reasons for Event: inadequate meal intake Comments/MD notified:hypoglycemia protocol     Dylan Salinas  Remember to initiate Hypoglycemia Order Set & complete

## 2013-02-24 NOTE — H&P (Signed)
Name: Dylan Salinas MRN: [id number] DOB: [birth year]    LOS: 1  Referring Dylan Salinas:  Dr. Manus Salinas Reason for Referral:  Hypotension  PULMONARY / CRITICAL CARE MEDICINE  HPI:  Mr. Dylan Salinas is a pleasant 79 year old man with past medical history of coronary artery disease, congestive heart failure, atrial fibrillation, aortic valve replacement, diabetes mellitus, and pulmonary embolism who was brought to the most common emergency room from his assisted living facility with increased fatigue and recent fall.  His son and daughter-in-law reports that for the last month to 6 weeks he has had progressive fatigue and weakness.  The day prior to admission he fell at his assisted living facility and they were concerned that because he is on Coumadin for his atrial fibrillation he might have hit his head and had a hemorrhage.  In ED he was found to be hypotensive and hypoxic, as well as having an elevated troponin and creatinine.  He was given 1 L normal saline with no significant improvement in his blood pressure.  [facility] M. was called for admission to tear his hypotension, hypoxia and multiple comorbidities.  Past Medical History  Diagnosis Date  . Atrial fibrillation   . Diabetes mellitus without complication   . Hypertension   . Anemia   . Depression   . PVD (peripheral vascular disease)   . Carotid stenosis   . Gastritis   . Hypothyroidism   . Hyponatremia   . Hx of CABG   . PE (pulmonary embolism)    History reviewed. No pertinent past surgical history. Prior to Admission medications   Medication Sig Start Date End Date Taking? Authorizing Carmella Kees  aspirin EC 81 MG tablet Take 81 mg by mouth every morning.   Yes Historical Annisha Baar, MD  atorvastatin (LIPITOR) 80 MG tablet Take 80 mg by mouth every evening.   Yes Historical Argelia Formisano, MD  docusate sodium (COLACE) 100 MG capsule Take 100 mg by mouth 2 (two) times daily.   Yes Historical Harry Bark, MD  famotidine (PEPCID) 20 MG tablet Take 20 mg by  mouth every morning.   Yes Historical Nadirah Socorro, MD  folic acid (FOLVITE) 1 MG tablet Take 2 mg by mouth every morning.   Yes Historical Tiahna Cure, MD  glipiZIDE (GLUCOTROL) 5 MG tablet Take 5 mg by mouth every morning.   Yes Historical Deona Novitski, MD  Iron-DSS-B12-FA-C-E-Cu-Biotin (HEMAX PO) Take 1 tablet by mouth 2 (two) times daily.   Yes Historical Greogry Goodwyn, MD  isosorbide mononitrate (IMDUR) 30 MG 24 hr tablet Take 30 mg by mouth every morning.   Yes Historical Tunya Held, MD  levothyroxine (SYNTHROID, LEVOTHROID) 112 MCG tablet Take 112 mcg by mouth every morning.   Yes Historical Charmine Bockrath, MD  lisinopril (PRINIVIL,ZESTRIL) 10 MG tablet Take 10-20 mg by mouth 2 (two) times daily. 2 tablets (20mg ) in the morning 1 tablet (10mg ) in the evening   Yes Historical Skarlette Lattner, MD  metolazone (ZAROXOLYN) 5 MG tablet Take 5 mg by mouth every morning.   Yes Historical Gaelen Brager, MD  moxifloxacin (AVELOX) 400 MG tablet Take 400 mg by mouth every morning. For 7 days Started 3/5   Yes Historical Jarmaine Ehrler, MD  Multiple Vitamin (MULTIVITAMIN WITH MINERALS) TABS Take 1 tablet by mouth daily.   Yes Historical Waldine Zenz, MD  nebivolol (BYSTOLIC) 5 MG tablet Take 5 mg by mouth every morning.   Yes Historical Jontrell Bushong, MD  nitroGLYCERIN (NITROSTAT) 0.4 MG SL tablet Place 0.4 mg under the tongue every 5 (five) minutes x 3 doses as needed for chest pain.  Yes Historical Ketzaly Cardella, MD  Omega-3 Fatty Acids (SEA-OMEGA 30 PO) Take 1 capsule by mouth 2 (two) times daily.   Yes Historical Katena Petitjean, MD  ranolazine (RANEXA) 500 MG 12 hr tablet Take 500 mg by mouth 2 (two) times daily.   Yes Historical Aiyah Scarpelli, MD  torsemide (DEMADEX) 20 MG tablet Take 20-40 mg by mouth 2 (two) times daily. 2 tablets (40mg ) in the morning 1 tablet (20mg ) at [facility].   Yes Historical Bevin Mayall, MD  traMADol (ULTRAM) 50 MG tablet Take 50 mg by mouth every 6 (six) hours as needed for pain.   Yes Historical Briellah Baik, MD  Warfarin 2mg  daily alternating with  3 mg every 3rd day  Allergies Allergies  Allergen Reactions  . Cephalosporins     Noted on MAR  . Cinchona Extract     Noted on MAR  . Macrolides And Ketolides     Noted on MAR  . Methenamine     Noted on MAR  . Quinidine     Noted on MAR  . Salicylates     Noted on MAR    Family History Family history reviewed and negative for CHF Social History  reports that he has never smoked. He does not have any smokeless tobacco history on file. He reports that he does not drink alcohol or use illicit drugs.  Review Of Systems:  A full review of systems was obtained and was negative except as stated in the HPI  Brief patient description:  79 year old man with multiple medical problems including CHF, CAD, and chronic kidney disease, diabetes, admitted for hypotension that appears to be due to decompensated heart failure.  Events Since Admission: Central line placed, started on dopamine  Current Status: critical  Vital Signs: Temp:  [94.9 F (34.9 C)-98.1 F (36.7 C)] 94.9 F (34.9 C) (03/09 0000) Pulse Rate:  [48-58] 55 (03/09 0345) Resp:  [12-21] 18 (03/09 0345) BP: (68-111)/(38-77) 96/47 mmHg (03/09 0345) SpO2:  [79 %-100 %] 96 % (03/09 0345) Weight:  [85 kg (187 lb 6.3 oz)] 85 kg (187 lb 6.3 oz) (03/08 2340)  Physical Examination: General:  Elderly male, laying in bed, no acute distress Neuro:  Opens eyes to tactile stimuli, does not answer questions, not oriented to person place or time HEENT:  Pupils equally round reactive to light, extraocular muscles intact OP clear Neck:  Supple, jugular venous distention to the earlobe at a 30 angle Cardiovascular:  Bradycardic, irregularly irregular, systolic click heard at left upper sternal border, 2/6 systolic murmur Lungs:  Bilateral basilar crackles, no wheezes, good air movement throughout Abdomen:  Distended, soft, nontender, positive bowel sounds Musculoskeletal:  No joint abnormalities, 1+ edema in bilateral lower  extremities, clubbing Skin:  Skin changes consistent with chronic venous stasis in lower extremities, multiple ulcers on anterior lower extremities  Principal Problem:   Hypotension Active Problems:   Hypoxia   CHF (congestive heart failure)   CKD (chronic kidney disease)   ASSESSMENT AND PLAN  PULMONARY  Recent Labs Lab 02/23/13 1724 02/24/13 0014  PHART 7.378 7.372  PCO2ART 38.9 38.6  PO2ART 49.0* 76.0*  HCO3 23.0 22.9  O2SAT 84.0 96.0   Ventilator Settings:   CXR:  Increased vascular markings, no obvious infiltrate ETT:  None  A:  Hypoxic respiratory failure likely secondary to pulmonary edema from decompensated heart failure P:   Patient stable right now on a nonrebreather, if requires increased oxygen we'll attempt CPAP   CARDIOVASCULAR  Recent Labs Lab 02/23/13 1539 02/23/13 1831  TROPONINI 1.01*  --   LATICACIDVEN  --  1.1  PROBNP 10881.0*  --    ECG:  Bradycardic, atrial fibrillation, wide QRS complex Lines: Right IJ placed 02/24/2013  A: Hypotension likely secondary to decompensated heart failure and bradycardia P:  BNP elevated at 10,000 Patient initiated on dopamine Will attempt to diurese once blood pressure improved on pressors Follow urine output closely  RENAL  Recent Labs Lab 02/23/13 1544  NA 134*  K 4.3  CL 95*  CO2 24  BUN 88*  CREATININE 3.76*  CALCIUM 8.6   Intake/Output     03/08 0701 - 03/09 0700   I.V. (mL/kg) 1031.3 (12.1)   Total Intake(mL/kg) 1031.3 (12.1)   Urine (mL/kg/hr) 310   Total Output 310   Net +721.3        Foley:  Placed 02/24/2013  A:  Likely acute on chronic renal failure P:   Patient with chronic renal failure baseline creatinine is unknown Follow daily BMP Follow urine output  GASTROINTESTINAL  Recent Labs Lab 02/23/13 1544  AST 117*  ALT 58*  ALKPHOS 124*  BILITOT 2.6*  PROT 6.4  ALBUMIN 2.6*    A:  Liver disease P:   Per patient's family he was recently diagnosed with  hepatitis A Unfortunately 2 to patient's Coumadin tonight his INR to monitor synthetic function Will obtain liver ultrasound Consider hepatology consult  HEMATOLOGIC  Recent Labs Lab 02/23/13 1544  HGB 11.9*  HCT 35.5*  PLT 151  INR 3.91*   A:  Anticoagulated for history of atrial fibrillation and mechanical valve P:  Supratherapeutic INR Hold Coumadin until INR less than 2.5 Depending on medical course at that point either start heparin GGT or restart Coumadin   INFECTIOUS  Recent Labs Lab 02/23/13 1544  WBC 5.8   Cultures: None Antibiotics: None  A:  No obvious signs or source of infection P:   Monitor for signs of infection  ENDOCRINE No results found for this basename: GLUCAP,  in the last 168 hours A:  Diabetes   P:   Controlled on oral hypoglycemics Hold orals and place patient on sliding scale insulin  NEUROLOGIC  A:  Altered mental status P:   Likely secondary to decompensated heart failure Head CT done in ER was negative for any acute process  BEST PRACTICE / DISPOSITION Level of Care:  ICU Primary Service:  PC CM Consultants:  Cardiology Code Status:  Full Diet:  N.p.o. DVT Px:  Anticoagulated GI Px:  Pepcid Skin Integrity:  Lesions on anterior shins Social / Family:  Family at bedside  I spent 45 minutes of critical care time in the care of this patient appeared her procedures which are documented elsewhere  Carolan Clines., M.D. Pulmonary and Critical Care Medicine [facility] Pager: [phone]    02/23/2013, 10:13 PM

## 2013-02-24 NOTE — Procedures (Signed)
Central Venous Catheter Insertion Procedure Note Dylan Salinas [id number] [birth year]  Procedure: Insertion of Central Venous Catheter Indications: Assessment of intravascular volume, Drug and/or fluid administration and Frequent blood sampling  Procedure Details Consent: Risks of procedure as well as the alternatives and risks of each were explained to the (patient/caregiver).  Consent for procedure obtained. Time Out: Verified patient identification, verified procedure, site/side was marked, verified correct patient position, special equipment/implants available, medications/allergies/relevent history reviewed, required imaging and test results available.  Performed  Maximum sterile technique was used including antiseptics, cap, gloves, gown, hand hygiene, mask and sheet. Skin prep: Chlorhexidine; local anesthetic administered A antimicrobial bonded/coated triple lumen catheter was placed in the right internal jugular vein using the Seldinger technique.  Evaluation Blood flow good Complications: No apparent complications Patient did tolerate procedure well. Chest X-Gurman ordered to verify placement.  CXR: normal.  GIDDINGS, [name] 02/24/2013, 02:00 AM

## 2013-02-24 NOTE — Progress Notes (Addendum)
Subjective:  Patient has remained stable overnight.  No chest pain or dyspnea. Rhythm stable chronic atrial fibrillation. States his headache is better.  Objective:  Vital Signs in the last 24 hours: Temp:  [94.9 F (34.9 C)-98.1 F (36.7 C)] 96.8 F (36 C) (03/09 0800) Pulse Rate:  [48-74] 69 (03/09 0900) Resp:  [12-23] 17 (03/09 0900) BP: (68-123)/(38-77) 123/61 mmHg (03/09 0900) SpO2:  [79 %-100 %] 98 % (03/09 0900) FiO2 (%):  [100 %] 100 % (03/09 0800) Weight:  [187 lb 6.3 oz (85 kg)] 187 lb 6.3 oz (85 kg) (03/08 2340)  Intake/Output from previous day: 03/08 0701 - 03/09 0700 In: 1214 [I.V.:1152; IV Piggyback:62] Out: 360 [Urine:360] Intake/Output from this shift: Total I/O In: 91.7 [I.V.:91.7] Out: 75 [Urine:75]  . antiseptic oral rinse  15 mL Mouth Rinse q12n4p  . aspirin EC  81 mg Oral q morning - 10a  . atorvastatin  80 mg Oral QPM  . chlorhexidine  15 mL Mouth Rinse BID  . dextrose  25 mL Intravenous Once  . docusate sodium  100 mg Oral BID  . famotidine  20 mg Oral q morning - 10a  . folic acid  2 mg Oral q morning - 10a  . hydrocortisone sod succinate (SOLU-CORTEF) injection  50 mg Intravenous Q6H  . insulin aspart  0-9 Units Subcutaneous Q4H  . levothyroxine  112 mcg Oral QAC breakfast  . nebivolol  2.5 mg Oral q morning - 10a  . ranolazine  500 mg Oral BID  . sodium chloride  10-40 mL Intracatheter Q12H   . DOPamine 10 mcg/kg/min (02/24/13 1100)    Physical Exam: The patient appears to be in no distress. The patient appears to be in no distress.  Head and neck exam reveals that the pupils are equal and reactive.  The extraocular movements are full.  There is no scleral icterus.  Mouth and pharynx are benign.  No lymphadenopathy.  No carotid bruits.  The jugular venous pressure is normal.  Thyroid is not enlarged or tender.  Chest is clear to percussion and auscultation.  No rales or rhonchi.  Expansion of the chest is symmetrical.  Heart reveals  good mechanical aortic valve clicks.  The abdomen is soft and nontender.  Hypoactive bowel sounds  Extremities reveal no phlebitis or edema.  Pedal pulses are good.  There is no cyanosis or clubbing.  Neurologic exam is normal strength and no lateralizing weakness.  No sensory deficits.  Integument reveals no rash   Lab Results:  Recent Labs  02/23/13 1544 02/24/13 0426  WBC 5.8 6.8  HGB 11.9* 13.8  PLT 151 159    Recent Labs  02/23/13 1544 02/24/13 0426  NA 134* 135  K 4.3 4.3  CL 95* 97  CO2 24 23  GLUCOSE 76 45*  BUN 88* 90*  CREATININE 3.76* 3.86*    Recent Labs  02/23/13 1539 02/24/13 0515  TROPONINI 1.01* 1.11*   Hepatic Function Panel  Recent Labs  02/23/13 1544  PROT 6.4  ALBUMIN 2.6*  AST 117*  ALT 58*  ALKPHOS 124*  BILITOT 2.6*   No results found for this basename: CHOL,  in the last 72 hours No results found for this basename: PROTIME,  in the last 72 hours  Imaging: Dg Chest 2 View  02/23/2013  *RADIOLOGY REPORT*  Clinical Data: Hypoxia  CHEST - 2 VIEW  Comparison: None.  Findings: Lungs are essentially clear.  No focal consolidation. No pleural effusion or pneumothorax.  Nodular opacities overlying the bilateral lower lobes are favored to reflect chest leads.  Cardiomegaly.  Postsurgical changes related to prior CABG.  Mild degenerative changes of the visualized thoracolumbar spine.  IMPRESSION: No evidence of acute cardiopulmonary disease.   Original Report Authenticated By: Charline Bills, M.D.    Ct Head Wo Contrast  02/23/2013  *RADIOLOGY REPORT*  Clinical Data: Fall.  On Coumadin.  Headache.  CT HEAD WITHOUT CONTRAST  Technique:  Contiguous axial images were obtained from the base of the skull through the vertex without contrast.  Comparison: None  Findings: Moderate atrophy.  Chronic microvascular ischemic change in the white matter.  No acute infarct.  No intracranial hemorrhage or mass.  Negative for skull fracture.  Multiple gas  bubbles are present in the soft tissues of the infratemporal fossa and in the soft tissues of the right scalp.  No obvious laceration is seen.  This may be venous air from IV insertion.  Does the patient have a right arm IV? There is calcification in the scalp bilaterally which may be arterial calcification.  IMPRESSION: Atrophy and chronic microvascular ischemia.  No acute intracranial abnormality.  Multiple gas bubbles in the soft tissues on the right, most likely introduced from a right arm IV.   Original Report Authenticated By: Janeece Riggers, M.D.    Dg Chest Port 1 View  02/24/2013  *RADIOLOGY REPORT*  Clinical Data: Central line placement.  PORTABLE CHEST - 1 VIEW  Comparison: Chest radiograph performed the 2014  Findings: The patient's right IJ line appears to end within the right atrium; this should be retracted 3-4 cm to the cavoatrial junction.  Mild left basilar opacity may reflect atelectasis or possibly pneumonia.  No pleural effusion or pneumothorax is seen.  The lungs are relatively well expanded.  Scattered calcified granulomata are suspected.  The cardiomediastinal silhouette is mildly enlarged.  The patient is status post median sternotomy, with evidence of prior CABG. Calcification is noted in the aortic arch.  No acute osseous abnormalities are seen.  IMPRESSION:  1.  Right IJ line appears to end within the right atrium; this should be retracted 3-4 cm to the cavoatrial junction. 2.  Mild left basilar airspace opacity may reflect atelectasis or possibly pneumonia. 3.  Mild cardiomegaly.  These results were called by telephone on 02/24/2013 at 01:58 a.m. to [facility] on [id number], who verbally acknowledged these results.   Original Report Authenticated By: Tonia Ghent, M.D.     Cardiac Studies: Telemetry shows atrial fib with controlled ventricular response. Assessment/Plan:  1. Elevated Troponin-I  Still trending up slightly. Echo shows RV volume overload.  The RV pressure is ~normal.    He may need a VQ scan to look for pulmonary embolus.  Although, I would suspect his PA pressures would be higher in the setting of a pulmonary embolus   2. CAD s/p CABG x 1 s/p PCI about 4 years ago  3. Mechanical Aortic Valve ([employer] Jude)  INR supratherapeutic. 4. AFIB with normal ventricular rate  5. Chronic Kidney Disease (stage 5, GFR 13) , creatinine is slightly higher today 6. NIDDM     LOS: 1 day    Cassell Clement 02/24/2013, 11:15 AM

## 2013-02-24 NOTE — Progress Notes (Signed)
Hypoglycemic Event  CBG:43  Treatment: D50 IV 25 mL  Symptoms: None  Follow-up CBG: Time: 0515 CBG Result: 90  Possible Reasons for Event: Unknown  Comments/MD notified: Dr. Kendrick Fries aware    Dylan Salinas, Dylan Salinas  Remember to initiate Hypoglycemia Order Set & complete

## 2013-02-25 LAB — CBC WITH DIFFERENTIAL/PLATELET
Basophils Relative: 0 % (ref 0–1)
Eosinophils Absolute: 0 10*3/uL (ref 0.0–0.7)
Eosinophils Relative: 0 % (ref 0–5)
HCT: 35.5 % — ABNORMAL LOW (ref 39.0–52.0)
Hemoglobin: 12.3 g/dL — ABNORMAL LOW (ref 13.0–17.0)
Lymphs Abs: 0.5 10*3/uL — ABNORMAL LOW (ref 0.7–4.0)
MCH: 30.4 pg (ref 26.0–34.0)
MCHC: 34.6 g/dL (ref 30.0–36.0)
MCV: 87.7 fL (ref 78.0–100.0)
Monocytes Absolute: 0.2 10*3/uL (ref 0.1–1.0)
Monocytes Relative: 3 % (ref 3–12)
Neutrophils Relative %: 89 % — ABNORMAL HIGH (ref 43–77)
RBC: 4.05 MIL/uL — ABNORMAL LOW (ref 4.22–5.81)

## 2013-02-25 LAB — GLUCOSE, CAPILLARY
Glucose-Capillary: 141 mg/dL — ABNORMAL HIGH (ref 70–99)
Glucose-Capillary: 145 mg/dL — ABNORMAL HIGH (ref 70–99)
Glucose-Capillary: 189 mg/dL — ABNORMAL HIGH (ref 70–99)

## 2013-02-25 LAB — BASIC METABOLIC PANEL
BUN: 100 mg/dL — ABNORMAL HIGH (ref 6–23)
CO2: 21 mEq/L (ref 19–32)
GFR calc non Af Amer: 11 mL/min — ABNORMAL LOW (ref >90)
Glucose, Bld: 189 mg/dL — ABNORMAL HIGH (ref 70–99)
Potassium: 4.7 mEq/L (ref 3.5–5.1)

## 2013-02-25 LAB — HEMOGLOBIN A1C
Hgb A1c MFr Bld: 5.8 % — ABNORMAL HIGH (ref <5.7)
Mean Plasma Glucose: 120 mg/dL — ABNORMAL HIGH (ref <117)

## 2013-02-25 MED ORDER — HYDROCORTISONE SOD SUCCINATE 100 MG IJ SOLR
50.0000 mg | Freq: Four times a day (QID) | INTRAMUSCULAR | Status: DC
  Administered 2013-02-25 – 2013-02-27 (×8): 50 mg via INTRAVENOUS
  Filled 2013-02-25 (×12): qty 1

## 2013-02-25 MED ORDER — ALBUMIN HUMAN 5 % IV SOLN
12.5000 g | Freq: Two times a day (BID) | INTRAVENOUS | Status: DC
  Administered 2013-02-25 – 2013-02-27 (×4): 12.5 g via INTRAVENOUS
  Filled 2013-02-25 (×8): qty 250

## 2013-02-25 NOTE — Progress Notes (Signed)
Name: Dylan Salinas MRN: [id number] DOB: 11/03/1920    LOS: 2  Referring Shamyah Stantz:  Dr. Manus Gunning Reason for Referral:  Hypotension  PULMONARY / CRITICAL CARE MEDICINE  BRIEF: Dylan Salinas is a pleasant 79 year old man with past medical history of coronary artery disease, congestive heart failure, atrial fibrillation, aortic valve replacement, diabetes mellitus, and pulmonary embolism admitted for unclear etiology shock.  Events Since Admission: 02/23/2013  3:23 PM > Admit. Central line placed, started on dopamine 3/8 ct head neg, bubbles in soft tissue from IV [place] abdo 3/8- Hepatic cirrhosis. No focal hepatic parenchymal abnormalities Moderate ascites. Cholelithiasis. Contracted gallbladder. No sonographic  evidence of acute cholecystitis.  Approximate 8 cm simple cyst arising from the lower pole of theleft kidney. 3/9 remains on dopmaine  SUBJECTIVE/OVERNIGHT/INTERVAL HX Remains on pressors Hungry  Vital Signs: Temp:  [97.3 F (36.3 C)-98.6 F (37 C)] 97.3 F (36.3 C) (03/10 1150) Pulse Rate:  [51-69] 61 (03/10 1200) Resp:  [13-23] 13 (03/10 1200) BP: (87-111)/(40-63) 108/55 mmHg (03/10 1200) SpO2:  [89 %-99 %] 95 % (03/10 1200) Weight:  [86.7 kg (191 lb 2.2 oz)] 86.7 kg (191 lb 2.2 oz) (03/10 0400)  Physical Examination: General: awake, laert Neuro:nonfocal, gen weakness HEENT: jvd wnl PULM: coarse CV: s1 s2 borderline brady GI: soft, bs wnl, no  Extremities: no edema  IMAGING x 48h Dg Chest 2 View  02/23/2013  *RADIOLOGY REPORT*  Clinical Data: Hypoxia  CHEST - 2 VIEW  Comparison: None.  Findings: Lungs are essentially clear.  No focal consolidation. No pleural effusion or pneumothorax.  Nodular opacities overlying the bilateral lower lobes are favored to reflect chest leads.  Cardiomegaly.  Postsurgical changes related to prior CABG.  Mild degenerative changes of the visualized thoracolumbar spine.  IMPRESSION: No evidence of acute cardiopulmonary disease.   Original Report  Authenticated By: Charline Bills, M.D.    Ct Head Wo Contrast  02/23/2013  *RADIOLOGY REPORT*  Clinical Data: Fall.  On Coumadin.  Headache.  CT HEAD WITHOUT CONTRAST  Technique:  Contiguous axial images were obtained from the base of the skull through the vertex without contrast.  Comparison: None  Findings: Moderate atrophy.  Chronic microvascular ischemic change in the white matter.  No acute infarct.  No intracranial hemorrhage or mass.  Negative for skull fracture.  Multiple gas bubbles are present in the soft tissues of the infratemporal fossa and in the soft tissues of the right scalp.  No obvious laceration is seen.  This may be venous air from IV insertion.  Does the patient have a right arm IV? There is calcification in the scalp bilaterally which may be arterial calcification.  IMPRESSION: Atrophy and chronic microvascular ischemia.  No acute intracranial abnormality.  Multiple gas bubbles in the soft tissues on the right, most likely introduced from a right arm IV.   Original Report Authenticated By: Janeece Riggers, M.D.    US Abdomen Complete  02/25/2013  *RADIOLOGY REPORT*  Clinical Data:  Recent diagnosis of hepatitis A, now with liver failure.  Current history of hypertension, diabetes, and chronic kidney disease.  COMPLETE ABDOMINAL ULTRASOUND  Comparison:  None.  Findings:  Gallbladder:  Contracted containing numerous shadowing gallstones. Borderline gallbladder wall thickening at 3 mm.  No pericholecystic fluid.  Negative sonographic Murphy's sign according to the ultrasound technologist.  Common bile duct:  Normal in caliber with maximum diameter approximating 3 mm.  Liver:  Contracted with irregular contour and coarse, echogenic parenchyma.  No focal hepatic parenchymal abnormality.  IVC:  Patent.  Pancreas:  Echogenic consistent with fatty replacement.  No focal parenchymal abnormality.  Spleen:  Normal in size and echotexture without focal parenchymal abnormality.  Right Kidney:  Diffuse  cortical thinning without focal parenchymal abnormality.  Normal parenchymal echotexture.  No hydronephrosis. No shadowing calculi.  Approximately 10.9 cm in length.  Left Kidney:  Diffuse cortical thinning.  Multiple cortical cysts, the largest measuring approximately 8.2 x 8.2 x 8.4 cm arising from the lower pole.  No solid parenchymal lesions.  No shadowing calculi.  No hydronephrosis.  Approximately 11.8 cm in length.  Abdominal aorta:  Normal in caliber in its proximal and midportions; obscured distally by overlying bowel gas.  Other findings:  Moderate upper abdominal ascites.  IMPRESSION:  1.  Hepatic cirrhosis.  No focal hepatic parenchymal abnormalities. 2.  Moderate ascites. 3.  Cholelithiasis.  Contracted gallbladder.  No sonographic evidence of acute cholecystitis. 4.  Approximate 8 cm simple cyst arising from the lower pole of the left kidney.   Original Report Authenticated By: Hulan Saas, M.D.    Dg Chest Port 1 View  02/24/2013  *RADIOLOGY REPORT*  Clinical Data: Central line placement.  PORTABLE CHEST - 1 VIEW  Comparison: Chest radiograph performed the 2014  Findings: The patient's right IJ line appears to end within the right atrium; this should be retracted 3-4 cm to the cavoatrial junction.  Mild left basilar opacity may reflect atelectasis or possibly pneumonia.  No pleural effusion or pneumothorax is seen.  The lungs are relatively well expanded.  Scattered calcified granulomata are suspected.  The cardiomediastinal silhouette is mildly enlarged.  The patient is status post median sternotomy, with evidence of prior CABG. Calcification is noted in the aortic arch.  No acute osseous abnormalities are seen.  IMPRESSION:  1.  Right IJ line appears to end within the right atrium; this should be retracted 3-4 cm to the cavoatrial junction. 2.  Mild left basilar airspace opacity may reflect atelectasis or possibly pneumonia. 3.  Mild cardiomegaly.  These results were called by telephone on  02/24/2013 at 01:58 a.m. to [facility] on [id number], who verbally acknowledged these results.   Original Report Authenticated By: Tonia Ghent, M.D.      Principal Problem:   Hypotension Active Problems:   Hypoxia   CHF (congestive heart failure)   CKD (chronic kidney disease)   Shock circulatory   Acute on chronic renal failure   ASSESSMENT AND PLAN  PULMONARY  Recent Labs Lab 02/23/13 1724 02/24/13 0014 02/24/13 1115  PHART 7.378 7.372  --   PCO2ART 38.9 38.6  --   PO2ART 49.0* 76.0*  --   HCO3 23.0 22.9  --   O2SAT 84.0 96.0 56.1    A:  Hypoxic respiratory failure likely secondary to pulmonary edema -improved P:    nasal oxygen, no distress Repeat pcxr for volume status in am  CARDIOVASCULAR  Recent Labs Lab 02/23/13 1539 02/23/13 1831 02/24/13 0515 02/24/13 1110 02/24/13 1830  TROPONINI 1.01*  --  1.11* 1.81* 1.44*  LATICACIDVEN  --  1.1  --   --   --   PROBNP 10881.0*  --   --   --   --     Recent Labs Lab 02/24/13 1110 02/25/13 0305  PROCALCITON 0.32 0.82    ECG:  Bradycardic, atrial fibrillation, wide QRS complex Lines: Right IJ placed 02/24/2013 Echo - 50% to 55%. There is hypokinesis of the basalinferior myocardium. Flattened septum consistent with elevated right ventricular pressure/ volume overload Mod-severe TR, aortic  prost  A: Hypotension likely secondary to decompensated heart failure and bradycardia, unclear etiology - 02/24/2013: CVP 17, BNP is 10,000 and troponins are mildly positive. R/o rel AI, likely we should accept lower MAP goals  P:  - Maintain dopamine  for systolic greater than 90 and map greater than 55, improved daily - echocardiogram reviewed, likely runs low BP with associated valvualr dz - ensure cortisol done and add stress steroids for now -cards seeing -cvp trend noted, limit gross pos balance further [place] Foley  = TSH, will send albunin challenge x 4 doses  RENAL  Recent Labs Lab 02/23/13 1544  02/24/13 0426 02/25/13 0305  NA 134* 135 132*  K 4.3 4.3 4.7  CL 95* 97 95*  CO2 24 23 21   BUN 88* 90* 100*  CREATININE 3.76* 3.86* 4.38*  CALCIUM 8.6 8.6 8.1*  MG  --  2.6*  --    Intake/Output     03/09 0701 - 03/10 0700 03/10 0701 - 03/11 0700   P.O. 275 150   I.V. (mL/kg) 565.2 (6.5) 138.8 (1.6)   IV Piggyback     Total Intake(mL/kg) 840.2 (9.7) 288.8 (3.3)   Urine (mL/kg/hr) 637 (0.3) 200 (0.4)   Total Output 637 200   Net +203.2 +88.8           A:  Likely acute on chronic renal failure baseline creatinine unknown, ATN from shock likely - 02/24/2013: Foley catheter is leaking. Creatinine is unchanged P:   Chem in am  cvp up, limit bolus and pos balance No hydro renal US  GASTROINTESTINAL  Recent Labs Lab 02/23/13 1544  AST 117*  ALT 58*  ALKPHOS 124*  BILITOT 2.6*  PROT 6.4  ALBUMIN 2.6*    Recent Labs Lab 02/23/13 1544 02/24/13 0426 02/25/13 0305  INR 3.91* 4.42* 4.47*     A:  Liver disease/mild transaminitis. Reported history of recent diagnosis of hepatitis A not otherwise specified  cirhosis on [place] P:   Etiology liver dz? Will d/w family May consider albumin with this presentation Start diet pepcid  HEMATOLOGIC  Recent Labs Lab 02/23/13 1544 02/24/13 0426 02/25/13 0305  HGB 11.9* 13.8 12.3*  HCT 35.5* 40.1 35.5*  PLT 151 159 159  INR 3.91* 4.42* 4.47*   A:  Anticoagulated for history of atrial fibrillation and mechanical valve - 02/24/2013: INR 4.4 no evidence of obvious bleeding  P:  Supratherapeutic INR Hold Coumadin until INR less than 2.5  INFECTIOUS  Recent Labs Lab 02/23/13 1544 02/24/13 0426 02/24/13 1110 02/25/13 0305  WBC 5.8 6.8  --  6.0  PROCALCITON  --   --  0.32 0.82    Recent Labs Lab 02/24/13 1110 02/25/13 0305  PROCALCITON 0.32 0.82    Cultures: None Antibiotics: None  A:  No obvious signs or source of infection P:   If continues to require pressors, may para  diagnostic  ENDOCRINE  Recent Labs Lab 02/24/13 2013 02/24/13 2357 02/25/13 0350 02/25/13 0818 02/25/13 1148  GLUCAP 102* 189* 178* 163* 146*   A:  Diabetes   P:   Controlled on oral hypoglycemics Hold orals and place patient on sliding scale insulin  NEUROLOGIC  A:  Altered mental status. Head CT in ER at admission negative for acute processes   - 02/24/2013: He is awake and alert  P - Monitor  -pt when off pressors  BEST PRACTICE / DISPOSITION Level of Care:  ICU Primary Service:  PC CM Consultants:  Cardiology Code Status:  Full -  need to discuss this with son dn pt, has major multiple chonic med problems Diet:  N.p.o. DVT Px:  Anticoagulated GI Px:  Pepcid Skin Integrity:  Lesions on anterior shins Social / Family:  Will call son  I spent 30 minutes of critical care time in the care of this patient appeared her procedures which are documented elsewhere  Mcarthur Rossetti. Tyson Alias, MD, FACP Pgr: [phone] [facility]

## 2013-02-25 NOTE — Clinical Documentation Improvement (Signed)
CHF DOCUMENTATION CLARIFICATION QUERY  THIS DOCUMENT IS NOT A PERMANENT PART OF THE MEDICAL RECORD  TO RESPOND TO THE THIS QUERY, FOLLOW THE INSTRUCTIONS BELOW:  1. If needed, update documentation for the patient's encounter via the notes activity.  2. Access this query again and click edit on the In [employer].  3. After updating, or not, click F2 to complete all highlighted (required) fields concerning your review. Select "additional documentation in the medical record" OR "no additional documentation provided".  4. Click Sign note button.  5. The deficiency will fall out of your In Basket *Please let us know if you are not able to complete this workflow by phone or e-mail (listed below).  Please update your documentation within the medical record to reflect your response to this query.                                                                                    02/25/13  Dear Dr. [facility],  In a better effort to capture your patient's severity of illness, reflect appropriate length of stay and utilization of resources, a review of the patient medical record has revealed the following indicators the diagnosis of Heart Failure.    Based on your clinical judgment, please clarify and document in a progress note and/or discharge summary the clinical condition associated with the following supporting information:  In responding to this query please exercise your independent judgment.  The fact that a query is asked, does not imply that any particular answer is desired or expected.   Possible Clinical Conditions?  Acute Systolic Congestive Heart Failure Acute Diastolic Congestive Heart Failure Acute Systolic & Diastolic Congestive Heart Failure Acute on Chronic Systolic Congestive Heart Failure Acute on Chronic Diastolic Congestive Heart Failure Acute on Chronic Systolic & Diastolic  Congestive Heart Failure Other Condition Cannot Clinically  Determine   Supporting Information:  Risk Factors: Decompensated CHF noted per  3/09 progress notes.  Diagnostic: 3/08: proBNP: 10,881.0   Reviewed: No additional documentation provided.                                                     Thank You,  Rodman Pickle, RN,BSN,  Clinical Documentation Specialist:  Pager: [phone]  Phone: [phone]  Health Information Management [place]

## 2013-02-25 NOTE — Clinical Social Work Psychosocial (Signed)
     Clinical Social Work Department BRIEF PSYCHOSOCIAL ASSESSMENT 02/25/2013  Patient:  Dylan Salinas, Dylan Salinas     Account Number:  [id number]     Admit date:  02/23/2013  Clinical Social Worker:  Margaree Mackintosh  Date/Time:  02/25/2013 12:00 M  Referred by:  Care Management  Date Referred:  02/25/2013 Referred for  ALF Placement   Other Referral:   Admitted from [facility].   Interview type:  Patient Other interview type:   [facility]ility[facility].    PSYCHOSOCIAL DATA Living Status:  FACILITY Admitted from facility:  [facility] Level of care:  Assisted Living Primary support name:  Dylan Salinas: [phone] Primary support relationship to patient:  CHILD, ADULT Degree of support available:   Unknown.    CURRENT CONCERNS Current Concerns  Post-Acute Placement   Other Concerns:    SOCIAL WORK ASSESSMENT / PLAN Clinical Social Worker recieved referral indicating pt is from [facility].  CSW reviewed chart and met with pt.  CSW introduced self, explained role, and provided support.  Pt confirmed he is a resident at [facility] and would like to return at dc.  CSW spoke with Annabelle Harman at [facility] who confirmed pt is a resident and welcome to return, post RN assessment, once medically stable.  CSW inquired as to when RN can come to hospital to complete evaluation, Annabelle Harman states RN is out of the office today.  CSW left contacat name and direct number and requested [facility] Staff follow up with CSW to assist with appropriate discharge planning; Annabelle Harman stated understanding.  CSW to conitnue to follow and assist as needed.   Assessment/plan status:  Information/Referral to [facility] Other assessment/ plan:   Information/referral to community resources:   ALF.    PATIENTS/FAMILYS RESPONSE TO PLAN OF CARE: Pt was pleasant and engaged in conversation. Pt thanked CSW for intervention.

## 2013-02-26 ENCOUNTER — Inpatient Hospital Stay (HOSPITAL_COMMUNITY): Payer: Medicare Other

## 2013-02-26 LAB — GLUCOSE, CAPILLARY: Glucose-Capillary: 151 mg/dL — ABNORMAL HIGH (ref 70–99)

## 2013-02-26 LAB — BASIC METABOLIC PANEL
CO2: 20 mEq/L (ref 19–32)
Calcium: 7.9 mg/dL — ABNORMAL LOW (ref 8.4–10.5)
Creatinine, Ser: 4.73 mg/dL — ABNORMAL HIGH (ref 0.50–1.35)
GFR calc non Af Amer: 10 mL/min — ABNORMAL LOW (ref >90)
Glucose, Bld: 156 mg/dL — ABNORMAL HIGH (ref 70–99)
Sodium: 133 mEq/L — ABNORMAL LOW (ref 135–145)

## 2013-02-26 LAB — CBC WITH DIFFERENTIAL/PLATELET
Basophils Absolute: 0 10*3/uL (ref 0.0–0.1)
Basophils Relative: 0 % (ref 0–1)
Eosinophils Absolute: 0 10*3/uL (ref 0.0–0.7)
Eosinophils Relative: 0 % (ref 0–5)
MCH: 30 pg (ref 26.0–34.0)
MCV: 87.7 fL (ref 78.0–100.0)
Neutrophils Relative %: 86 % — ABNORMAL HIGH (ref 43–77)
Platelets: 155 10*3/uL (ref 150–400)
RDW: 17.2 % — ABNORMAL HIGH (ref 11.5–15.5)

## 2013-02-26 LAB — CORTISOL
Cortisol, Plasma: 95.4 ug/dL
Cortisol, Plasma: 95 ug/dL

## 2013-02-26 MED ORDER — VITAMIN K1 10 MG/ML IJ SOLN
5.0000 mg | Freq: Once | INTRAVENOUS | Status: AC
  Administered 2013-02-26: 5 mg via INTRAVENOUS
  Filled 2013-02-26: qty 0.5

## 2013-02-26 NOTE — Progress Notes (Signed)
Subjective:  Patient has remained stable overnight.  No chest pain or dyspnea. Rhythm stable chronic atrial fibrillation. States his headache is better.  Objective:  Vital Signs in the last 24 hours: Temp:  [96.2 F (35.7 C)-97.9 F (36.6 C)] 97 F (36.1 C) (03/11 1100) Pulse Rate:  [39-109] 62 (03/11 0945) Resp:  [15-25] 18 (03/11 0945) BP: (99-123)/(51-85) 108/59 mmHg (03/11 0945) SpO2:  [74 %-99 %] 94 % (03/11 0945) Weight:  [192 lb 3.9 oz (87.2 kg)] 192 lb 3.9 oz (87.2 kg) (03/11 0407)  Intake/Output from previous day: 03/10 0701 - 03/11 0700 In: 975.5 [P.O.:150; I.V.:575.5; IV Piggyback:250] Out: 635 [Urine:635] Intake/Output from this shift: Total I/O In: 250 [P.O.:200; I.V.:50] Out: -   . albumin human  12.5 g Intravenous Q12H  . antiseptic oral rinse  15 mL Mouth Rinse q12n4p  . aspirin EC  81 mg Oral q morning - 10a  . chlorhexidine  15 mL Mouth Rinse BID  . docusate sodium  100 mg Oral BID  . famotidine  20 mg Oral q morning - 10a  . folic acid  2 mg Oral q morning - 10a  . hydrocortisone sodium succinate  50 mg Intravenous Q6H  . insulin aspart  0-9 Units Subcutaneous Q4H  . levothyroxine  112 mcg Oral QAC breakfast  . nebivolol  2.5 mg Oral q morning - 10a  . phytonadione (VITAMIN K) IV  5 mg Intravenous Once  . ranolazine  500 mg Oral BID  . sodium chloride  10-40 mL Intracatheter Q12H   . DOPamine 2 mcg/kg/min (02/25/13 2200)    Physical Exam: The patient appears to be in no distress.  Head and neck exam :  normal  Chest is clear .  No rales or rhonchi.    Heart reveals good mechanical aortic valve click, 2/6 systolic murmur, ? Soft diastolic murmur.  The abdomen is soft and nontender.  Hypoactive bowel sounds  Extremities reveal no phlebitis or edema.  Pedal pulses are good.  There is no cyanosis or clubbing.  Neuro: normal.    Lab Results:  Recent Labs  02/25/13 0305 02/26/13 0430  WBC 6.0 6.7  HGB 12.3* 11.5*  PLT 159 155     Recent Labs  02/25/13 0305 02/26/13 0430  NA 132* 133*  K 4.7 4.5  CL 95* 95*  CO2 21 20  GLUCOSE 189* 156*  BUN 100* 119*  CREATININE 4.38* 4.73*    Recent Labs  02/24/13 1110 02/24/13 1830  TROPONINI 1.81* 1.44*   Hepatic Function Panel  Recent Labs  02/23/13 1544  PROT 6.4  ALBUMIN 2.6*  AST 117*  ALT 58*  ALKPHOS 124*  BILITOT 2.6*   No results found for this basename: CHOL,  in the last 72 hours No results found for this basename: PROTIME,  in the last 72 hours  Imaging: US Abdomen Complete  02/25/2013  *RADIOLOGY REPORT*  Clinical Data:  Recent diagnosis of hepatitis A, now with liver failure.  Current history of hypertension, diabetes, and chronic kidney disease.  COMPLETE ABDOMINAL ULTRASOUND  Comparison:  None.  Findings:  Gallbladder:  Contracted containing numerous shadowing gallstones. Borderline gallbladder wall thickening at 3 mm.  No pericholecystic fluid.  Negative sonographic Murphy's sign according to the ultrasound technologist.  Common bile duct:  Normal in caliber with maximum diameter approximating 3 mm.  Liver:  Contracted with irregular contour and coarse, echogenic parenchyma.  No focal hepatic parenchymal abnormality.  IVC:  Patent.  Pancreas:  Echogenic  consistent with fatty replacement.  No focal parenchymal abnormality.  Spleen:  Normal in size and echotexture without focal parenchymal abnormality.  Right Kidney:  Diffuse cortical thinning without focal parenchymal abnormality.  Normal parenchymal echotexture.  No hydronephrosis. No shadowing calculi.  Approximately 10.9 cm in length.  Left Kidney:  Diffuse cortical thinning.  Multiple cortical cysts, the largest measuring approximately 8.2 x 8.2 x 8.4 cm arising from the lower pole.  No solid parenchymal lesions.  No shadowing calculi.  No hydronephrosis.  Approximately 11.8 cm in length.  Abdominal aorta:  Normal in caliber in its proximal and midportions; obscured distally by overlying bowel  gas.  Other findings:  Moderate upper abdominal ascites.  IMPRESSION:  1.  Hepatic cirrhosis.  No focal hepatic parenchymal abnormalities. 2.  Moderate ascites. 3.  Cholelithiasis.  Contracted gallbladder.  No sonographic evidence of acute cholecystitis. 4.  Approximate 8 cm simple cyst arising from the lower pole of the left kidney.   Original Report Authenticated By: Hulan Saas, M.D.    Dg Chest Port 1 View  02/26/2013  *RADIOLOGY REPORT*  Clinical Data: Evaluate for pulmonary edema.  PORTABLE CHEST - 1 VIEW  Comparison: Chest x-[facility] 02/24/2013.  Findings: There is a right-sided internal jugular central venous catheter with tip terminating in the superior aspect of the right atrium (approximate 2.2 cm below the superior cavoatrial junction). Status post median sternotomy for CABG.  Calcified granuloma in the right mid lung.  No consolidative airspace disease.  No pleural effusions.  Pulmonary vasculature is within normal limits.  Heart size is mild to moderately enlarged.  The upper mediastinal contours are within normal limits.  Atherosclerosis in the thoracic aorta.  New electronic monitoring device projecting over the upper left hemithorax.  IMPRESSION: 1.  Tip of right IJ catheter remains in the superior aspect of the right atrium and could be withdrawn approximately 2.2 cm for more optimal placement at the superior cavoatrial junction. 2.  Improving lung volumes without radiographic evidence of acute cardiopulmonary disease on today's examination. 3.  Mild to moderate cardiomegaly redemonstrated. 4.  Atherosclerosis.   Original Report Authenticated By: Trudie Reed, M.D.     Cardiac Studies: Telemetry shows atrial fib with controlled ventricular response. Assessment/Plan:  1. Elevated Troponin-  Continue current conservative therapy.  2. CAD s/p CABG x 1 s/p PCI about 4 years ago  3. Mechanical Aortic Valve ([employer] Jude)  INR supratherapeutic. 4. AFIB with normal ventricular rate  5. Chronic  Kidney Disease (stage 5, GFR 13) , creatinine is slightly higher today 6. NIDDM     LOS: 3 days    Elyn Aquas. 02/26/2013, 1:18 PM

## 2013-02-26 NOTE — Clinical Documentation Improvement (Signed)
CHANGE MENTAL STATUS DOCUMENTATION CLARIFICATION   THIS DOCUMENT IS NOT A PERMANENT PART OF THE MEDICAL RECORD  TO RESPOND TO THE THIS QUERY, FOLLOW THE INSTRUCTIONS BELOW:  1. If needed, update documentation for the patient's encounter via the notes activity.  2. Access this query again and click edit on the In [employer].  3. After updating, or not, click F2 to complete all highlighted (required) fields concerning your review. Select "additional documentation in the medical record" OR "no additional documentation provided".  4. Click Sign note button.  5. The deficiency will fall out of your In Basket *Please let us know if you are not able to complete this workflow by phone or e-mail (listed below).         02/26/13  Dear Dr. Tyson Alias Marton Redwood  In an effort to better capture your patient's severity of illness, reflect appropriate length of stay and utilization of resources, a review of the patient medical record has revealed the following indicators.    Based on your clinical judgment, please clarify and document in a progress note and/or discharge summary the clinical condition associated with the following supporting information:  In responding to this query please exercise your independent judgment.  The fact that a query is asked, does not imply that any particular answer is desired or expected.   Possible Clinical Conditions?  _______Encephalopathy (describe type if known)                       Anoxic                       Septic                       Alcoholic                        Hepatic                       Hypertensive                       Metabolic                       Toxic _______Traumatic brain injury _______Traumatic brain hemorrhage _______Drug induced confusion/delirium _______Acute confusion _______Acute delirium _______Acute exacerbation of known dementia (indicate type) _______New diagnosis of Dementia, Alzheimer's, cerebral  atherosclerosis _______Hypoxemia / Hypoxia _______Other Condition _______Cannot Clinically Determine     Supporting Information:  AMS noted per 3/08 progress notes.  Patient reports headache after un-witnessed fall per 3/08 progress notes.   Diagnostics: CT scan: Head CT negative for acute process per 3/08 progress notes.   Treatment:  Reviewed: No additional documentation provided.                                                 Thank You,  Marciano Sequin,  Clinical Documentation Specialist:  Pager: [phone]  Phone: [phone]  Health Information Management [place]

## 2013-02-26 NOTE — Progress Notes (Signed)
Clinical Social Worker spoke with Marisue Humble at [facility]; Marisue Humble to arrive at hospital tomorrow for re-evaluation of pt.  CSW submitted appropriate clinical information to [facility]F.  CSW to continue to follow and assist as needed.   Angelia Mould, MSW, [place] [phone]

## 2013-02-26 NOTE — Progress Notes (Signed)
Pt sleeping and not fully aqwake. Concerned that patient is not awake enough to give 2200 doses of Colase and Ranexa. Will reasseess later for administration

## 2013-02-26 NOTE — Progress Notes (Signed)
Name: Dylan Salinas MRN: [id number] DOB: Jan 03, 1920    LOS: 3  Referring Provider:  Dr. Manus Gunning Reason for Referral:  Hypotension  PULMONARY / CRITICAL CARE MEDICINE  BRIEF: Dylan Salinas is a pleasant 79 year old man with past medical history of coronary artery disease, congestive heart failure, atrial fibrillation, aortic valve replacement, diabetes mellitus, and pulmonary embolism admitted for unclear etiology shock.  Events Since Admission: 02/23/2013  3:23 PM > Admit. Central line placed, started on dopamine 3/8 ct head neg, bubbles in soft tissue from IV [place] abdo 3/8- Hepatic cirrhosis. No focal hepatic parenchymal abnormalities Moderate ascites. Cholelithiasis. Contracted gallbladder. No sonographic  evidence of acute cholecystitis.  Approximate 8 cm simple cyst arising from the lower pole of theleft kidney. 3/9 remains on dopmaine 3/11 dopa off  SUBJECTIVE/OVERNIGHT/INTERVAL HX Pressors off since last pm Still confused, but interacting, redirectable Up to chair  Vital Signs: Temp:  [96.2 F (35.7 C)-97.9 F (36.6 C)] 97.5 F (36.4 C) (03/11 0813) Pulse Rate:  [39-109] 62 (03/11 0945) Resp:  [13-25] 18 (03/11 0945) BP: (99-123)/(51-85) 108/59 mmHg (03/11 0945) SpO2:  [74 %-99 %] 94 % (03/11 0945) Weight:  [87.2 kg (192 lb 3.9 oz)] 87.2 kg (192 lb 3.9 oz) (03/11 0407)  Physical Examination: General: awake, alert,  Neuro: poorly oriented, gen weakness HEENT: jvd wnl PULM: coarse CV: s1 s2 borderline brady GI: soft, bs wnl, no  Extremities: no edema  IMAGING x 48h US Abdomen Complete  02/25/2013  *RADIOLOGY REPORT*  Clinical Data:  Recent diagnosis of hepatitis A, now with liver failure.  Current history of hypertension, diabetes, and chronic kidney disease.  COMPLETE ABDOMINAL ULTRASOUND  Comparison:  None.  Findings:  Gallbladder:  Contracted containing numerous shadowing gallstones. Borderline gallbladder wall thickening at 3 mm.  No pericholecystic fluid.  Negative  sonographic Murphy's sign according to the ultrasound technologist.  Common bile duct:  Normal in caliber with maximum diameter approximating 3 mm.  Liver:  Contracted with irregular contour and coarse, echogenic parenchyma.  No focal hepatic parenchymal abnormality.  IVC:  Patent.  Pancreas:  Echogenic consistent with fatty replacement.  No focal parenchymal abnormality.  Spleen:  Normal in size and echotexture without focal parenchymal abnormality.  Right Kidney:  Diffuse cortical thinning without focal parenchymal abnormality.  Normal parenchymal echotexture.  No hydronephrosis. No shadowing calculi.  Approximately 10.9 cm in length.  Left Kidney:  Diffuse cortical thinning.  Multiple cortical cysts, the largest measuring approximately 8.2 x 8.2 x 8.4 cm arising from the lower pole.  No solid parenchymal lesions.  No shadowing calculi.  No hydronephrosis.  Approximately 11.8 cm in length.  Abdominal aorta:  Normal in caliber in its proximal and midportions; obscured distally by overlying bowel gas.  Other findings:  Moderate upper abdominal ascites.  IMPRESSION:  1.  Hepatic cirrhosis.  No focal hepatic parenchymal abnormalities. 2.  Moderate ascites. 3.  Cholelithiasis.  Contracted gallbladder.  No sonographic evidence of acute cholecystitis. 4.  Approximate 8 cm simple cyst arising from the lower pole of the left kidney.   Original Report Authenticated By: Hulan Saas, M.D.    Dg Chest Port 1 View  02/26/2013  *RADIOLOGY REPORT*  Clinical Data: Evaluate for pulmonary edema.  PORTABLE CHEST - 1 VIEW  Comparison: Chest x-[place] 02/24/2013.  Findings: There is a right-sided internal jugular central venous catheter with tip terminating in the superior aspect of the right atrium (approximate 2.2 cm below the superior cavoatrial junction). Status post median sternotomy for CABG.  Calcified granuloma in  the right mid lung.  No consolidative airspace disease.  No pleural effusions.  Pulmonary vasculature is within  normal limits.  Heart size is mild to moderately enlarged.  The upper mediastinal contours are within normal limits.  Atherosclerosis in the thoracic aorta.  New electronic monitoring device projecting over the upper left hemithorax.  IMPRESSION: 1.  Tip of right IJ catheter remains in the superior aspect of the right atrium and could be withdrawn approximately 2.2 cm for more optimal placement at the superior cavoatrial junction. 2.  Improving lung volumes without radiographic evidence of acute cardiopulmonary disease on today's examination. 3.  Mild to moderate cardiomegaly redemonstrated. 4.  Atherosclerosis.   Original Report Authenticated By: Trudie Reed, M.D.      Principal Problem:   Hypotension Active Problems:   Hypoxia   CHF (congestive heart failure)   CKD (chronic kidney disease)   Shock circulatory   Acute on chronic renal failure   ASSESSMENT AND PLAN  PULMONARY  Recent Labs Lab 02/23/13 1724 02/24/13 0014 02/24/13 1115  PHART 7.378 7.372  --   PCO2ART 38.9 38.6  --   PO2ART 49.0* 76.0*  --   HCO3 23.0 22.9  --   O2SAT 84.0 96.0 56.1    A:  Hypoxic respiratory failure likely secondary to pulmonary edema -improved P:   nasal oxygen, wean as tolerated Defer diuresis at this time  CARDIOVASCULAR  Recent Labs Lab 02/23/13 1539 02/23/13 1831 02/24/13 0515 02/24/13 1110 02/24/13 1830  TROPONINI 1.01*  --  1.11* 1.81* 1.44*  LATICACIDVEN  --  1.1  --   --   --   PROBNP 10881.0*  --   --   --   --     Recent Labs Lab 02/24/13 1110 02/25/13 0305 02/26/13 0430  PROCALCITON 0.32 0.82 0.79    ECG:  Bradycardic, atrial fibrillation, wide QRS complex Lines: Right IJ placed 02/24/2013 Echo - 50% to 55%. There is hypokinesis of the basalinferior myocardium. Flattened septum consistent with elevated right ventricular pressure/ volume overload Mod-severe TR, aortic prost  A: Hypotension likely secondary to decompensated heart failure and bradycardia,  unclear etiology, aortic and tricuspid valve disease. TSH >> 1.763 P:  - weaned dopa to off - continue stress steroids for now, wean to off if maintains MAP goals - cards seeing - cvp trend noted, limit gross pos balance further  RENAL  Recent Labs Lab 02/23/13 1544 02/24/13 0426 02/25/13 0305 02/26/13 0430  NA 134* 135 132* 133*  K 4.3 4.3 4.7 4.5  CL 95* 97 95* 95*  CO2 24 23 21 20   BUN 88* 90* 100* 119*  CREATININE 3.76* 3.86* 4.38* 4.73*  CALCIUM 8.6 8.6 8.1* 7.9*  MG  --  2.6*  --   --    Intake/Output     03/10 0701 - 03/11 0700 03/11 0701 - 03/12 0700   P.O. 150 200   I.V. (mL/kg) 575.5 (6.6) 50 (0.6)   IV Piggyback 250    Total Intake(mL/kg) 975.5 (11.2) 250 (2.9)   Urine (mL/kg/hr) 635 (0.3)    Total Output 635     Net +340.5 +250           A:  Likely acute on chronic renal failure baseline creatinine unknown, ATN from shock likely P:   - following UOP and BMP - hopefully will enter post-ATN recovery; discussed with son 3/11 that he is not a good HD candidate  GASTROINTESTINAL  Recent Labs Lab 02/23/13 1544  AST 117*  ALT 58*  ALKPHOS 124*  BILITOT 2.6*  PROT 6.4  ALBUMIN 2.6*    Recent Labs Lab 02/23/13 1544 02/24/13 0426 02/25/13 0305  INR 3.91* 4.42* 4.47*     A:  Liver disease/mild transaminitis. Reported history of recent diagnosis of hepatitis A not otherwise specified,  cirhosis on [place] of unclear etiology P:   Discussed that he has an acute injury, plus probable underlying chronic disease Continue diet pepcid Repeat LFT 3/12  HEMATOLOGIC  Recent Labs Lab 02/23/13 1544 02/24/13 0426 02/25/13 0305 02/26/13 0430  HGB 11.9* 13.8 12.3* 11.5*  HCT 35.5* 40.1 35.5* 33.6*  PLT 151 159 159 155  INR 3.91* 4.42* 4.47*  --    A:  Anticoagulated for history of atrial fibrillation and mechanical valve  P:  Hold Coumadin until INR less than 2.5 Vit K x 1 3/11  INFECTIOUS  Recent Labs Lab 02/23/13 1544 02/24/13 0426  02/24/13 1110 02/25/13 0305 02/26/13 0430  WBC 5.8 6.8  --  6.0 6.7  PROCALCITON  --   --  0.32 0.82 0.79    Recent Labs Lab 02/24/13 1110 02/25/13 0305 02/26/13 0430  PROCALCITON 0.32 0.82 0.79   Cultures: None Antibiotics: None  A:  No obvious signs or source of infection P:   If need pressors restarted, may para diagnostic  ENDOCRINE  Recent Labs Lab 02/25/13 1530 02/25/13 1948 02/25/13 2350 02/26/13 0406 02/26/13 0807  GLUCAP 145* 141* 144* 151* 141*   A:  Diabetes  Controlled on oral hypoglycemics P:   Hold orals and place patient on sliding scale insulin  NEUROLOGIC  A:  Altered mental status. Head CT in ER at admission negative for acute processes   P - Monitor   Goals of care >> discussed with son at bedside 3/11. We will watch progress, especially w regard to renal fxn. If he declines or if renal fxn does not improve then we will likely move towards comfort care. He may benefit from home hospice care no matter how much improvement we make here.  Will enter limited code orders in the chart  Levy Pupa, MD, PhD 02/26/2013, 12:42 PM [facility] [phone] or if no answer [phone]

## 2013-02-27 LAB — GLUCOSE, CAPILLARY
Glucose-Capillary: 113 mg/dL — ABNORMAL HIGH (ref 70–99)
Glucose-Capillary: 128 mg/dL — ABNORMAL HIGH (ref 70–99)
Glucose-Capillary: 164 mg/dL — ABNORMAL HIGH (ref 70–99)
Glucose-Capillary: 200 mg/dL — ABNORMAL HIGH (ref 70–99)
Glucose-Capillary: 162 mg/dL — ABNORMAL HIGH (ref 70–99)

## 2013-02-27 LAB — CBC WITH DIFFERENTIAL/PLATELET
Eosinophils Absolute: 0 10*3/uL (ref 0.0–0.7)
Eosinophils Relative: 0 % (ref 0–5)
HCT: 34.4 % — ABNORMAL LOW (ref 39.0–52.0)
Lymphs Abs: 0.6 10*3/uL — ABNORMAL LOW (ref 0.7–4.0)
MCH: 30.1 pg (ref 26.0–34.0)
MCV: 87.1 fL (ref 78.0–100.0)
Monocytes Absolute: 0.3 10*3/uL (ref 0.1–1.0)
Monocytes Relative: 5 % (ref 3–12)
Platelets: 138 10*3/uL — ABNORMAL LOW (ref 150–400)
RBC: 3.95 MIL/uL — ABNORMAL LOW (ref 4.22–5.81)

## 2013-02-27 LAB — BASIC METABOLIC PANEL
CO2: 17 mEq/L — ABNORMAL LOW (ref 19–32)
Calcium: 8 mg/dL — ABNORMAL LOW (ref 8.4–10.5)
Glucose, Bld: 118 mg/dL — ABNORMAL HIGH (ref 70–99)
Sodium: 134 mEq/L — ABNORMAL LOW (ref 135–145)

## 2013-02-27 LAB — PROTIME-INR: Prothrombin Time: 27 seconds — ABNORMAL HIGH (ref 11.6–15.2)

## 2013-02-27 LAB — HEPATITIS PANEL, ACUTE: Hepatitis B Surface Ag: NEGATIVE

## 2013-02-27 MED ORDER — SODIUM CHLORIDE 0.9 % IV SOLN
250.0000 mL | INTRAVENOUS | Status: DC | PRN
  Administered 2013-02-27: 250 mL via INTRAVENOUS

## 2013-02-27 MED ORDER — HYDROCORTISONE SOD SUCCINATE 100 MG IJ SOLR
50.0000 mg | Freq: Two times a day (BID) | INTRAMUSCULAR | Status: DC
  Administered 2013-02-27 – 2013-03-01 (×4): 50 mg via INTRAVENOUS
  Filled 2013-02-27 (×6): qty 1

## 2013-02-27 NOTE — Progress Notes (Signed)
Name: Dylan Salinas MRN: [id number] DOB: 1920/03/12    LOS: 4  Referring Provider:  Dr. Manus Gunning Reason for Referral:  Hypotension  PULMONARY / CRITICAL CARE MEDICINE  BRIEF: Dylan Salinas is a pleasant 79 year old man with past medical history of coronary artery disease, congestive heart failure, atrial fibrillation, aortic valve replacement, diabetes mellitus, and pulmonary embolism admitted for unclear etiology shock.  Events Since Admission: 02/23/2013  3:23 PM > Admit. Central line placed, started on dopamine 3/8 ct head neg, bubbles in soft tissue from IV [place] abdo 3/8- Hepatic cirrhosis. No focal hepatic parenchymal abnormalities Moderate ascites. Cholelithiasis. Contracted gallbladder. No sonographic  evidence of acute cholecystitis.  Approximate 8 cm simple cyst arising from the lower pole of theleft kidney. 3/9 remains on dopmaine 3/11 dopa off  SUBJECTIVE/OVERNIGHT/INTERVAL HX Up to chair, very winded w exertion  Vital Signs: Temp:  [97.3 F (36.3 C)-97.8 F (36.6 C)] 97.8 F (36.6 C) (03/12 1159) Pulse Rate:  [55-62] 62 (03/12 1118) Resp:  [16-24] 23 (03/12 1100) BP: (99-135)/(54-72) 120/62 mmHg (03/12 1118) SpO2:  [84 %-100 %] 96 % (03/12 1118) Weight:  [87.3 kg (192 lb 7.4 oz)] 87.3 kg (192 lb 7.4 oz) (03/12 0445)  Intake/Output Summary (Last 24 hours) at 02/27/13 1241 Last data filed at 02/27/13 1100  Gross per 24 hour  Intake   1220 ml  Output    420 ml  Net    800 ml    Physical Examination: General: awake, alert,  Neuro: poorly oriented, gen weakness HEENT: jvd wnl PULM: coarse CV: s1 s2 borderline brady GI: soft, bs wnl, no  Extremities: no edema  IMAGING x 48h Dg Chest Port 1 View  02/26/2013  *RADIOLOGY REPORT*  Clinical Data: Evaluate for pulmonary edema.  PORTABLE CHEST - 1 VIEW  Comparison: Chest x-Attilio 02/24/2013.  Findings: There is a right-sided internal jugular central venous catheter with tip terminating in the superior aspect of the right atrium  (approximate 2.2 cm below the superior cavoatrial junction). Status post median sternotomy for CABG.  Calcified granuloma in the right mid lung.  No consolidative airspace disease.  No pleural effusions.  Pulmonary vasculature is within normal limits.  Heart size is mild to moderately enlarged.  The upper mediastinal contours are within normal limits.  Atherosclerosis in the thoracic aorta.  New electronic monitoring device projecting over the upper left hemithorax.  IMPRESSION: 1.  Tip of right IJ catheter remains in the superior aspect of the right atrium and could be withdrawn approximately 2.2 cm for more optimal placement at the superior cavoatrial junction. 2.  Improving lung volumes without radiographic evidence of acute cardiopulmonary disease on today's examination. 3.  Mild to moderate cardiomegaly redemonstrated. 4.  Atherosclerosis.   Original Report Authenticated By: Trudie Reed, M.D.      Principal Problem:   Hypotension Active Problems:   Hypoxia   CHF (congestive heart failure)   CKD (chronic kidney disease)   Shock circulatory   Acute on chronic renal failure   ASSESSMENT AND PLAN  PULMONARY  Recent Labs Lab 02/23/13 1724 02/24/13 0014 02/24/13 1115  PHART 7.378 7.372  --   PCO2ART 38.9 38.6  --   PO2ART 49.0* 76.0*  --   HCO3 23.0 22.9  --   O2SAT 84.0 96.0 56.1    A:  Hypoxic respiratory failure likely secondary to pulmonary edema -improved P:   nasal oxygen, wean as tolerated Defer diuresis at this time  CARDIOVASCULAR  Recent Labs Lab 02/23/13 1539 02/23/13 1831 02/24/13 0515  02/24/13 1110 02/24/13 1830  TROPONINI 1.01*  --  1.11* 1.81* 1.44*  LATICACIDVEN  --  1.1  --   --   --   PROBNP 10881.0*  --   --   --   --     Recent Labs Lab 02/24/13 1110 02/25/13 0305 02/26/13 0430  PROCALCITON 0.32 0.82 0.79    ECG:  Bradycardic, atrial fibrillation, wide QRS complex Lines: Right IJ placed 02/24/2013 Echo - 50% to 55%. There is  hypokinesis of the basalinferior myocardium. Flattened septum consistent with elevated right ventricular pressure/ volume overload Mod-severe TR, aortic prost  A: Hypotension likely secondary to decompensated heart failure and bradycardia, unclear etiology, aortic and tricuspid valve disease. TSH >> 1.763 P:  - weaned dopa to off - continue stress steroids for now, begin wean to off 3/12 - cards following - d/c CVPs  RENAL  Recent Labs Lab 02/23/13 1544 02/24/13 0426 02/25/13 0305 02/26/13 0430 02/27/13 0445  NA 134* 135 132* 133* 134*  K 4.3 4.3 4.7 4.5 4.3  CL 95* 97 95* 95* 96  CO2 24 23 21 20  17*  BUN 88* 90* 100* 119* 141*  CREATININE 3.76* 3.86* 4.38* 4.73* 5.05*  CALCIUM 8.6 8.6 8.1* 7.9* 8.0*  MG  --  2.6*  --   --   --    Intake/Output     03/11 0701 - 03/12 0700 03/12 0701 - 03/13 0700   P.O. 680    I.V. (mL/kg) 470 (5.4) 80 (0.9)   IV Piggyback 550    Total Intake(mL/kg) 1700 (19.5) 80 (0.9)   Urine (mL/kg/hr) 220 (0.1) 200 (0.4)   Total Output 220 200   Net +1480 -120        Urine Occurrence 2 x 1 x      A:  Likely acute on chronic renal failure baseline creatinine unknown, ATN from shock likely P:   - following UOP and BMP - hopefully will enter post-ATN recovery; discussed with son 3/11 that he is not a good HD candidate  GASTROINTESTINAL  Recent Labs Lab 02/23/13 1544  AST 117*  ALT 58*  ALKPHOS 124*  BILITOT 2.6*  PROT 6.4  ALBUMIN 2.6*    Recent Labs Lab 02/23/13 1544 02/24/13 0426 02/25/13 0305 02/27/13 0445  INR 3.91* 4.42* 4.47* 2.65*     A:  Liver disease/mild transaminitis. Reported history of recent diagnosis of hepatitis A not otherwise specified,  cirhosis on [place] of unclear etiology P:   Discussed that he has an acute injury, plus probable underlying chronic disease Continue diet pepcid Repeat LFT 3/12  HEMATOLOGIC  Recent Labs Lab 02/23/13 1544 02/24/13 0426 02/25/13 0305 02/26/13 0430 02/27/13 0445  HGB  11.9* 13.8 12.3* 11.5* 11.9*  HCT 35.5* 40.1 35.5* 33.6* 34.4*  PLT 151 159 159 155 138*  INR 3.91* 4.42* 4.47*  --  2.65*   A:  Anticoagulated for history of atrial fibrillation and mechanical valve  P:  Hold Coumadin until INR less than 2.5 Vit K x 1 3/11  INFECTIOUS  Recent Labs Lab 02/23/13 1544 02/24/13 0426 02/24/13 1110 02/25/13 0305 02/26/13 0430 02/27/13 0445  WBC 5.8 6.8  --  6.0 6.7 7.1  PROCALCITON  --   --  0.32 0.82 0.79  --     Recent Labs Lab 02/24/13 1110 02/25/13 0305 02/26/13 0430  PROCALCITON 0.32 0.82 0.79   Cultures: None Antibiotics: None  A:  No obvious signs or source of infection P:   If need pressors  restarted, may para diagnostic  ENDOCRINE  Recent Labs Lab 02/26/13 2057 02/26/13 2359 02/27/13 0346 02/27/13 0727 02/27/13 1147  GLUCAP 175* 143* 113* 128* 164*   A:  Diabetes  Controlled on oral hypoglycemics P:   Hold orals and place patient on sliding scale insulin  NEUROLOGIC  A:  Altered mental status. Head CT in ER at admission negative for acute processes   P - Monitor   Goals of care >> discussed with son at bedside 3/11. We will watch progress, especially w regard to renal fxn. If he declines or if renal fxn does not improve then we will likely move towards comfort care. He may benefit from home hospice care no matter how much improvement we make here.  Discussions underway with SW regarding possible [facility] appreciate their input.   Will transfer to floor bed 3/12.   Levy Pupa, MD, PhD 02/27/2013, 12:39 PM [facility] [phone] or if no answer [phone]

## 2013-02-27 NOTE — Progress Notes (Signed)
Subjective:  No CP, appears comfortable in bed  Objective:  Vital Signs in the last 24 hours: Temp:  [97 F (36.1 C)-97.6 F (36.4 C)] 97.6 F (36.4 C) (03/12 0745) Pulse Rate:  [55-62] 58 (03/12 0929) Resp:  [16-24] 22 (03/12 0929) BP: (99-135)/(54-72) 125/64 mmHg (03/12 0900) SpO2:  [84 %-100 %] 84 % (03/12 0929) Weight:  [192 lb 7.4 oz (87.3 kg)] 192 lb 7.4 oz (87.3 kg) (03/12 0445)  Intake/Output from previous day: 03/11 0701 - 03/12 0700 In: 1700 [P.O.:680; I.V.:470; IV Piggyback:550] Out: 220 [Urine:220] Intake/Output from this shift: Total I/O In: 40 [I.V.:40] Out: -   . albumin human  12.5 g Intravenous Q12H  . antiseptic oral rinse  15 mL Mouth Rinse q12n4p  . aspirin EC  81 mg Oral q morning - 10a  . chlorhexidine  15 mL Mouth Rinse BID  . docusate sodium  100 mg Oral BID  . famotidine  20 mg Oral q morning - 10a  . folic acid  2 mg Oral q morning - 10a  . hydrocortisone sodium succinate  50 mg Intravenous Q6H  . insulin aspart  0-9 Units Subcutaneous Q4H  . levothyroxine  112 mcg Oral QAC breakfast  . nebivolol  2.5 mg Oral q morning - 10a  . ranolazine  500 mg Oral BID  . sodium chloride  10-40 mL Intracatheter Q12H   . DOPamine 2 mcg/kg/min (02/25/13 2200)    Physical Exam: The patient appears to be in no distress.  Head and neck exam :  normal  Chest:  Wheezing this am.  Heart reveals good mechanical aortic valve click, 2/6 systolic murmur, ? Soft diastolic murmur.  The abdomen - distended, hypoactive bowel sounds.    Extremities reveal no phlebitis or edema.  Pedal pulses are good.  There is no cyanosis or clubbing.  Neuro: normal.    Lab Results:  Recent Labs  02/26/13 0430 02/27/13 0445  WBC 6.7 7.1  HGB 11.5* 11.9*  PLT 155 138*    Recent Labs  02/26/13 0430 02/27/13 0445  NA 133* 134*  K 4.5 4.3  CL 95* 96  CO2 20 17*  GLUCOSE 156* 118*  BUN 119* 141*  CREATININE 4.73* 5.05*    Recent Labs  02/24/13 1110  02/24/13 1830  TROPONINI 1.81* 1.44*   Hepatic Function Panel No results found for this basename: PROT, ALBUMIN, AST, ALT, ALKPHOS, BILITOT, BILIDIR, IBILI,  in the last 72 hours No results found for this basename: CHOL,  in the last 72 hours No results found for this basename: PROTIME,  in the last 72 hours  Imaging: Dg Chest Port 1 View  02/26/2013  *RADIOLOGY REPORT*  Clinical Data: Evaluate for pulmonary edema.  PORTABLE CHEST - 1 VIEW  Comparison: Chest x-Kerrigan 02/24/2013.  Findings: There is a right-sided internal jugular central venous catheter with tip terminating in the superior aspect of the right atrium (approximate 2.2 cm below the superior cavoatrial junction). Status post median sternotomy for CABG.  Calcified granuloma in the right mid lung.  No consolidative airspace disease.  No pleural effusions.  Pulmonary vasculature is within normal limits.  Heart size is mild to moderately enlarged.  The upper mediastinal contours are within normal limits.  Atherosclerosis in the thoracic aorta.  New electronic monitoring device projecting over the upper left hemithorax.  IMPRESSION: 1.  Tip of right IJ catheter remains in the superior aspect of the right atrium and could be withdrawn approximately 2.2 cm for more  optimal placement at the superior cavoatrial junction. 2.  Improving lung volumes without radiographic evidence of acute cardiopulmonary disease on today's examination. 3.  Mild to moderate cardiomegaly redemonstrated. 4.  Atherosclerosis.   Original Report Authenticated By: Trudie Reed, M.D.     Cardiac Studies: Telemetry shows atrial fib with controlled ventricular response. Assessment/Plan:  1. Elevated Troponin-  Continue current conservative therapy. Echo 02/24/13  Study Conclusions  - Left ventricle: The cavity size was normal. Systolic function was normal. The estimated ejection fraction was in the range of 50% to 55%. There is hypokinesis of the basalinferior  myocardium. Flattened septum consistent with elevated right ventricular pressure/ volume overload. The study is not technically sufficient to allow evaluation of LV diastolic function. - Aortic valve: A mechanical prosthesis was present and functioning normally. - Mitral valve: Calcified annulus. Mild regurgitation. - Left atrium: The atrium was moderately dilated. - Right ventricle: The cavity size was moderately dilated. Wall thickness was normal. Systolic function was moderately reduced. - Right atrium: The atrium was severely dilated. - Tricuspid valve: Moderate-severe regurgitation.  2. CAD s/p CABG x 1 s/p PCI about 4 years ago   3. Mechanical Aortic Valve (st Jude)  INR supratherapeutic.  4. AFIB with normal ventricular rate   5. Chronic Kidney Disease (stage 5, GFR 9) , creatinine  continues to increase. Plans per PCCM  6. NIDDM     LOS: 4 days    Elyn Aquas. 02/27/2013, 9:45 AM

## 2013-02-27 NOTE — Evaluation (Signed)
Occupational Therapy Evaluation Patient Details Name: Dylan Salinas MRN: [id number] DOB: 01/23/1920 Today's Date: 02/27/2013 Time: 4098-1191 OT Time Calculation (min): 34 min  OT Assessment / Plan / Recommendation Clinical Impression  Pleasant 79 yr old male admitted with renal failure and hypotension as well as afib.  Pt currently needs min to mod assist for most LB selfcare tasks and functional transfers.  Feel he will benefit from acute care OT to help increase overall independence with basic selfcare tasks.  Feel he will need 24 hour supervision/ assist at discharge.  SNF recommended if pt is medically stable enogh to participate.    OT Assessment  Patient needs continued OT Services    Follow Up Recommendations  SNF    Barriers to Discharge Decreased caregiver support    Equipment Recommendations  None recommended by OT       Frequency  Min 2X/week    Precautions / Restrictions Precautions Precautions: Fall Restrictions Weight Bearing Restrictions: No   Pertinent Vitals/Pain O2 sats stable greater than 92% on room air with activity, BP 120's over 60's    ADL  Eating/Feeding: Simulated;Independent Where Assessed - Eating/Feeding: Chair Grooming: Simulated;Set up Where Assessed - Grooming: Unsupported sitting Upper Body Bathing: Simulated;Set up Where Assessed - Upper Body Bathing: Unsupported sitting Lower Body Bathing: Simulated;Moderate assistance Where Assessed - Lower Body Bathing: Supported sit to stand Upper Body Dressing: Simulated;Supervision/safety Where Assessed - Upper Body Dressing: Unsupported sitting Lower Body Dressing: Simulated;Maximal assistance Where Assessed - Lower Body Dressing: Supported sit to stand Toilet Transfer: Mining engineer Method: Surveyor, minerals:  (to bedside chair) Toileting - Clothing Manipulation and Hygiene: Simulated;Minimal assistance Where Assessed - Medical sales representative and Hygiene: Sit to stand from 3-in-1 or toilet Tub/Shower Transfer Method: Not assessed Equipment Used: Rolling walker;Gait belt Transfers/Ambulation Related to ADLs: Pt is overall min assist for functional transfers and for short distance mobility using the RW. ADL Comments: He demonstrates limited endurance however O2 sats remained greater than 92% on room air during session.  BP also stable in sitting as well.  Pt unable to reach either foot for simulated dressing tasks.  May benefit from AE instruction for greater independence.  Will definately need 24 hour supervision and continued OT at SNF level for discharge if prognosis remains good from a medical standpoint.    OT Diagnosis: Generalized weakness;Cognitive deficits  OT Problem List: Decreased strength;Decreased activity tolerance;Impaired balance (sitting and/or standing);Decreased knowledge of use of DME or AE;Decreased cognition OT Treatment Interventions: Self-care/ADL training;Energy conservation;DME and/or AE instruction;Balance training;Patient/family education;Therapeutic activities   OT Goals Acute Rehab OT Goals OT Goal Formulation: With patient ADL Goals Pt Will Perform Grooming: with supervision;Standing at sink;Other (comment);Supported (2 tasks) ADL Goal: Grooming - Progress: Goal set today Pt Will Perform Lower Body Bathing: with supervision;Sit to stand from bed;with adaptive equipment ADL Goal: Lower Body Bathing - Progress: Goal set today Pt Will Perform Lower Body Dressing: with supervision;with adaptive equipment;Sit to stand from bed ADL Goal: Lower Body Dressing - Progress: Goal set today Pt Will Transfer to Toilet: with supervision;with DME;3-in-1 ADL Goal: Toilet Transfer - Progress: Goal set today Miscellaneous OT Goals Miscellaneous OT Goal #1: Pt will transition supine to sit EOB with supervision in preparation for selfcare tasks. OT Goal: Miscellaneous Goal #1 - Progress: Goal set  today  Visit Information  Last OT Received On: 02/27/13 Assistance Needed: +1    Subjective Data  Subjective: Your going to have to help me up. Patient Stated  Goal: Did not state but agreeable to participate in OT session.   Prior Functioning     Home Living Lives With: Alone Type of Home:  ([place]) Home Layout: One level Bathroom Shower/Tub: Health visitor: Standard Bathroom Accessibility: Yes Home Adaptive Equipment: Walker - four wheeled;Built-in shower seat;Grab bars in shower;Bedside commode/3-in-1 Prior Function Level of Independence: Needs assistance Needs Assistance: Dressing Dressing: Minimal (ted stockings) Able to Take Stairs?: No Driving: No Vocation: Retired Musician: No difficulties Dominant Hand: Right         Vision/Perception Vision - History Baseline Vision: Wears glasses all the time Patient Visual Report: No change from baseline Vision - Assessment Vision Assessment: Vision not tested Perception Perception: Within Functional Limits Praxis Praxis: Intact   Cognition  Cognition Overall Cognitive Status: Impaired Area of Impairment: Memory Arousal/Alertness: Awake/alert Orientation Level: Disoriented to;Place;Time Behavior During Session: WFL for tasks performed    Extremity/Trunk Assessment Right Upper Extremity Assessment RUE ROM/Strength/Tone: Deficits RUE ROM/Strength/Tone Deficits: Shoulder flexion bilateral to 110 degrees, pt reporting bilateral shoulder pain with end range.  All other joints AROM WFLS with strength 4/5 throughout. RUE Sensation: WFL - Light Touch RUE Coordination: Deficits RUE Coordination Deficits: pt reports difficulty with fastening buttons Left Upper Extremity Assessment LUE ROM/Strength/Tone: Deficits LUE ROM/Strength/Tone Deficits: Shoulder flexion bilateral to 110 degrees, pt reporting bilateral shoulder pain with end range.  All other joints AROM WFLS with strength  4/5 throughout. LUE Coordination: Deficits LUE Coordination Deficits: pt reports difficulty with fastening buttons     Mobility Bed Mobility Bed Mobility: Supine to Sit Supine to Sit: 4: Min assist Details for Bed Mobility Assistance: mod instructional cueing to sequence transfer Transfers Transfers: Sit to Stand;Stand to Sit Sit to Stand: 4: Min assist;With upper extremity assist;From bed Stand to Sit: 4: Min assist;3: Mod assist;Without upper extremity assist Details for Transfer Assistance: Pt flopped down into the chair and did not attempt use of his UEs to control the descend.        Balance Balance Balance Assessed: Yes Dynamic Standing Balance Dynamic Standing - Balance Support: Right upper extremity supported;Left upper extremity supported Dynamic Standing - Level of Assistance: 4: Min assist;Other (comment) (with use of the RW)   End of Session OT - End of Session Equipment Utilized During Treatment: Gait belt Activity Tolerance: Patient limited by fatigue Patient left: in chair;with call bell/phone within reach;with family/visitor present Nurse Communication: Mobility status     MCGUIRE,JAMES OTR/L Pager number [phone] 02/27/2013, 12:15 PM

## 2013-02-27 NOTE — Progress Notes (Signed)
Clinical Child psychotherapist received phone call from [facility] at [facility].  Per Carley Hammed, family contacted [facility] yesterday and expressed interest in [facility].  Carley Hammed to meet with family today and follow up with CSW and RNCM.  CSW to continue to follow and assist as needed.    Angelia Mould, MSW, [place] [phone]

## 2013-02-27 NOTE — Evaluation (Signed)
Physical Therapy Evaluation Patient Details Name: Dylan Salinas MRN: [id number] DOB: 1920-03-30 Today's Date: 02/27/2013 Time: 0102-7253 PT Time Calculation (min): 30 min  PT Assessment / Plan / Recommendation Clinical Impression  Pt s/p hypotension and renal failure with decr mobility secondary to decr endurance and decr balance. Will benefit from PT to maximize function.  Needs SNF for therapy prior to return to A living.    PT Assessment  Patient needs continued PT services    Follow Up Recommendations  SNF;Supervision/Assistance - 24 hour         Barriers to Discharge Decreased caregiver support      Equipment Recommendations  None recommended by PT         Frequency Min 3X/week    Precautions / Restrictions Precautions Precautions: Fall Restrictions Weight Bearing Restrictions: No   Pertinent Vitals/Pain VSS, No pain      Mobility  Bed Mobility Bed Mobility: Supine to Sit Supine to Sit: 4: Min assist Details for Bed Mobility Assistance: mod instructional cueing to sequence transfer Transfers Sit to Stand: 4: Min assist;With upper extremity assist;From bed Stand to Sit: 4: Min assist;3: Mod assist;Without upper extremity assist Details for Transfer Assistance: Pt flopped down into the chair and did not attempt use of his UEs to control the descend. Ambulation/Gait Ambulation/Gait Assistance: 4: Min assist Ambulation Distance (Feet): 23 Feet Assistive device: Rolling walker Gait Pattern: Step-to pattern;Decreased stride length;Decreased step length - right;Decreased step length - left;Shuffle;Trunk flexed Gait velocity: decreased Stairs: No Wheelchair Mobility Wheelchair Mobility: No         PT Diagnosis: Generalized weakness  PT Problem List: Decreased activity tolerance;Decreased balance;Decreased mobility;Decreased safety awareness;Decreased knowledge of precautions;Decreased knowledge of use of DME PT Treatment Interventions: DME instruction;Gait  training;Functional mobility training;Therapeutic activities;Balance training;Therapeutic exercise;Patient/family education   PT Goals Acute Rehab PT Goals PT Goal Formulation: With patient Time For Goal Achievement: 03/13/13 Potential to Achieve Goals: Good Pt will go Supine/Side to Sit: Independently PT Goal: Supine/Side to Sit - Progress: Goal set today Pt will go Sit to Stand: Independently PT Goal: Sit to Stand - Progress: Goal set today Pt will Transfer Bed to Chair/Chair to Bed: Independently PT Transfer Goal: Bed to Chair/Chair to Bed - Progress: Goal set today Pt will Ambulate: 51 - 150 feet;with supervision;with least restrictive assistive device PT Goal: Ambulate - Progress: Goal set today Pt will Perform Home Exercise Program: with supervision, verbal cues required/provided PT Goal: Perform Home Exercise Program - Progress: Goal set today  Visit Information  Last PT Received On: 02/27/13 Assistance Needed: +2 PT/OT Co-Evaluation/Treatment: Yes    Subjective Data  Subjective: "Yesterday, I couldn't even take a step." Patient Stated Goal: To go home   Prior Functioning  Home Living Lives With: Alone Type of Home: Assisted living Home Layout: One level Bathroom Shower/Tub: Health visitor: Standard Bathroom Accessibility: Yes Home Adaptive Equipment: Walker - four wheeled;Built-in shower seat;Grab bars in shower;Bedside commode/3-in-1 Prior Function Level of Independence: Needs assistance Needs Assistance: Dressing Dressing: Minimal Able to Take Stairs?: No Driving: No Vocation: Retired Musician: No difficulties Dominant Hand: Right    Cognition  Cognition Overall Cognitive Status: Impaired Area of Impairment: Memory Arousal/Alertness: Awake/alert Orientation Level: Disoriented to;Place;Time Behavior During Session: WFL for tasks performed    Extremity/Trunk Assessment Right Upper Extremity Assessment RUE  ROM/Strength/Tone: Deficits RUE ROM/Strength/Tone Deficits: Shoulder flexion bilateral to 110 degrees, pt reporting bilateral shoulder pain with end range.  All other joints AROM WFLS with strength 4/5 throughout. RUE  Sensation: WFL - Light Touch RUE Coordination: Deficits RUE Coordination Deficits: pt reports difficulty with fastening buttons Left Upper Extremity Assessment LUE ROM/Strength/Tone: Deficits LUE ROM/Strength/Tone Deficits: Shoulder flexion bilateral to 110 degrees, pt reporting bilateral shoulder pain with end range.  All other joints AROM WFLS with strength 4/5 throughout. LUE Coordination: Deficits LUE Coordination Deficits: pt reports difficulty with fastening buttons Right Lower Extremity Assessment RLE ROM/Strength/Tone: [facility] for tasks assessed Left Lower Extremity Assessment LLE ROM/Strength/Tone: WFL for tasks assessed Trunk Assessment Trunk Assessment: Normal   Balance Balance Balance Assessed: Yes Dynamic Standing Balance Dynamic Standing - Balance Support: Right upper extremity supported;Left upper extremity supported Dynamic Standing - Level of Assistance: 4: Min assist;Other (comment) (with use of RW)  End of Session PT - End of Session Equipment Utilized During Treatment: Gait belt;Oxygen Activity Tolerance: Patient tolerated treatment well Patient left: in chair;with call bell/phone within reach;with family/visitor present Nurse Communication: Mobility status       INGOLD,DAWN 02/27/2013, 1:30 PM [facility] Acute Rehabilitation [phone] (pager)

## 2013-02-27 NOTE — Consult Note (Signed)
[facility] Liaison: Patient's son contacted [facility] for information on Hospice and [facility]. Spoke with CSW Aundra Millet prior to engaging with son who reports they are looking for information to help make decision. He requested Terrilee Files MD review record to get more information. Son expecting conversation with Dr. Delton Coombes today as well. Met briefly with Mr. Witt after he worked with OT. Spoke with Dr. Delton Coombes and RNCM on unit. Will update staff and family. Thank you. Forrestine Him LCSW [phone]

## 2013-02-28 DIAGNOSIS — I214 Non-ST elevation (NSTEMI) myocardial infarction: Secondary | ICD-10-CM

## 2013-02-28 LAB — PROTIME-INR
INR: 1.99 — ABNORMAL HIGH (ref 0.00–1.49)
Prothrombin Time: 21.8 seconds — ABNORMAL HIGH (ref 11.6–15.2)

## 2013-02-28 LAB — GLUCOSE, CAPILLARY
Glucose-Capillary: 103 mg/dL — ABNORMAL HIGH (ref 70–99)
Glucose-Capillary: 125 mg/dL — ABNORMAL HIGH (ref 70–99)
Glucose-Capillary: 137 mg/dL — ABNORMAL HIGH (ref 70–99)

## 2013-02-28 LAB — BASIC METABOLIC PANEL
Calcium: 8.2 mg/dL — ABNORMAL LOW (ref 8.4–10.5)
Creatinine, Ser: 5.52 mg/dL — ABNORMAL HIGH (ref 0.50–1.35)
GFR calc Af Amer: 9 mL/min — ABNORMAL LOW (ref >90)
GFR calc non Af Amer: 8 mL/min — ABNORMAL LOW (ref >90)
Sodium: 133 mEq/L — ABNORMAL LOW (ref 135–145)

## 2013-02-28 MED ORDER — WARFARIN SODIUM 3 MG PO TABS
3.0000 mg | ORAL_TABLET | Freq: Once | ORAL | Status: AC
  Administered 2013-02-28: 3 mg via ORAL
  Filled 2013-02-28: qty 1

## 2013-02-28 MED ORDER — WARFARIN - PHARMACIST DOSING INPATIENT: Freq: Every day | Status: DC

## 2013-02-28 NOTE — Discharge Summary (Signed)
Physician Discharge Summary     Patient ID: Dylan Salinas MRN: [id number] DOB/AGE: [birth year] 77 y.o.  Admit date: 02/23/2013 Discharge date: 03/01/2013  Admission Diagnoses:  Discharge Diagnoses:  Principal Problem:   Hypotension Active Problems:   Hypoxia   CHF (congestive heart failure)   CKD (chronic kidney disease)   Shock circulatory   Acute on chronic renal failure   Significant Hospital tests/ studies/ interventions and procedures  Events Since Admission:  02/23/2013 3:23 PM > Admit. Central line placed, started on dopamine  3/8 ct head neg, bubbles in soft tissue from IV  [place] abdo 3/8- Hepatic cirrhosis. No focal hepatic parenchymal abnormalities Moderate ascites. Cholelithiasis. Contracted gallbladder. No sonographic evidence of acute cholecystitis. Approximate 8 cm simple cyst arising from the lower pole of theleft kidney.  3/9 remains on dopmaine  3/11 dopa off   BRIEF: Dylan. Dylan Salinas is a pleasant 79 year old man with past medical history of coronary artery disease, congestive heart failure, atrial fibrillation, aortic valve replacement, diabetes mellitus, and pulmonary embolism admitted for unclear etiology shock.   Hospital Course:  1) Hypotension/cardiogenic shock (resolved) 2) Elevated troponins   likely secondary to decompensated heart failure and bradycardia, unclear etiology, aortic and tricuspid valve disease. Admitted to the ICU, central line placed. Cardiology consultation obtained. Supported on dopamine gtt and stress dose steroids. . Eventually weaned off dopamine. Cardiology recommended conservative care.  - weaned dopa to off.  -At time of discharge his BP remains stable. Unfortunately renal function has continued to decline and pt does not want hemodialysis, nor is he a candidate for this.  3) CAD s/p CABG X 1 and PCI 34 years ago 4) Afib Mechan[employer] aortic valve (st Jude) 5) Hypoxic respiratory failure likely secondary to pulmonary edema -improved  6)  progressive acute on chronic renal failure baseline creatinine unknown, ATN from shock likely, continues to decline. Looking like ESRD at this point no evidence of renal recovery.  Recent Labs     02/27/13  0445  02/28/13  0610  03/01/13  0555  CREATININE  5.05*  5.52*  5.62*  7) Liver disease/mild transaminitis. Reported history of recent diagnosis of hepatitis A not otherwise specified,  cirhosis on [place] of unclear etiology  All due to acute injury, plus probable underlying chronic disease  8) Anticoagulated for history of atrial fibrillation and mechanical valve: Have discussed coumadin therapy with patient. He cannot continue coumadin dosing at [facility]. He is in agreement to focus on comfort.  Lab Results  Component Value Date   INR 2.06* 03/01/2013   INR 1.99* 02/28/2013   INR 2.65* 02/27/2013  9) Diabetes   Dylan Salinas has survived his initial critical illness, unfortunately the aftermath of residual injury, specifically acute renal failure progressing to ESRD will prevent him from full recovery. He would need dialysis in order treat current acidosis, volume overload, uremia and soon to come dyspnea and hyperkalemia. He is not a candidate for HD and would not likely tolerate traditional methods of dialysis. He has expressed twice now that this is not at all interested in this even if it could be offered. We have had a long discussion with Dylan Salinas personally, then with his family. At this point there is nothing more medical science can offer that is likely to change his likelihood for survival. He has reached the point where medical efforts need to focus on comfort. We have been fortunate enough to get him a bed at [facility], have discussed this with his family. They will  continue comfort based care from here on forward. We have explained to the patient and family that his current condition is terminal. We have all agreed to transition to comfort based care.   Discharge Exam: BP 117/73   Pulse 63  Temp(Src) 98.1 F (36.7 C) (Oral)  Resp 20  Ht 5\' 8"  (1.727 m)  Wt 87.1 kg (192 lb 0.3 oz)  BMI 29.2 kg/m2  SpO2 97% 3 liters  General: awake, alert,  Neuro: poorly oriented, gen weakness  HEENT: jvd wnl  PULM: coars, audible rhonchi at bedside.  CV: s1 s2 borderline brady  GI: soft, bs wnl, no  Extremities: no edema   Labs at discharge Lab Results  Component Value Date   CREATININE 5.62* 03/01/2013   BUN 161* 03/01/2013   NA 135 03/01/2013   K 4.7 03/01/2013   CL 96 03/01/2013   CO2 15* 03/01/2013   Lab Results  Component Value Date   WBC 7.1 02/27/2013   HGB 11.9* 02/27/2013   HCT 34.4* 02/27/2013   MCV 87.1 02/27/2013   PLT 138* 02/27/2013   Lab Results  Component Value Date   ALT 58* 02/23/2013   AST 117* 02/23/2013   ALKPHOS 124* 02/23/2013   BILITOT 2.6* 02/23/2013   Lab Results  Component Value Date   INR 2.06* 03/01/2013   INR 1.99* 02/28/2013   INR 2.65* 02/27/2013    Current radiology studies No results found.  Disposition:  Final discharge disposition not confirmed      Discharge Orders   Future Orders Complete By Expires     Diet - low sodium heart healthy  As directed     Diet regular  As directed     For home use only DME oxygen  As directed     Questions:      Mode or (Route):  Nasal cannula    Liters per Minute:      Frequency:      Oxygen conserving device:      Increase activity slowly  As directed         Medication List    STOP taking these medications       atorvastatin 80 MG tablet  Commonly known as:  LIPITOR     docusate sodium 100 MG capsule  Commonly known as:  COLACE     famotidine 20 MG tablet  Commonly known as:  PEPCID     folic acid 1 MG tablet  Commonly known as:  FOLVITE     glipiZIDE 5 MG tablet  Commonly known as:  GLUCOTROL     HEMAX PO     isosorbide mononitrate 30 MG 24 hr tablet  Commonly known as:  IMDUR     levothyroxine 112 MCG tablet  Commonly known as:  SYNTHROID, LEVOTHROID      lisinopril 10 MG tablet  Commonly known as:  PRINIVIL,ZESTRIL     metolazone 5 MG tablet  Commonly known as:  ZAROXOLYN     moxifloxacin 400 MG tablet  Commonly known as:  AVELOX     multivitamin with minerals Tabs     nebivolol 5 MG tablet  Commonly known as:  BYSTOLIC     nitroGLYCERIN 0.4 MG SL tablet  Commonly known as:  NITROSTAT     ranolazine 500 MG 12 hr tablet  Commonly known as:  RANEXA     SEA-OMEGA 30 PO     torsemide 20 MG tablet  Commonly known as:  DEMADEX  traMADol 50 MG tablet  Commonly known as:  ULTRAM     warfarin 2 MG tablet  Commonly known as:  COUMADIN      TAKE these medications       aspirin EC 81 MG tablet  Take 81 mg by mouth every morning.     LORazepam 1 MG tablet  Commonly known as:  ATIVAN  Take 1 tablet (1 mg total) by mouth every 8 (eight) hours.     morphine CONCENTRATE 10 mg / 0.5 ml concentrated solution  Place 0.5 mLs (10 mg total) under the tongue every 2 (two) hours as needed for pain (for pain or dyspnea).         Discharged Condition: poor  Physician Statement:   The Patient was personally examined, the discharge assessment and plan has been personally reviewed and I agree with ACNP Niley Helbig's assessment and plan. > 30 minutes of time have been dedicated to discharge assessment, planning and discharge instructions.   Signed: Tarek Cravens,PETE 03/01/2013, 10:53 AM

## 2013-02-28 NOTE — Clinical Social Work Placement (Addendum)
    Clinical Social Work Department CLINICAL SOCIAL WORK PLACEMENT NOTE 02/28/2013  Patient:  HALSTON, KINTZ  Account Number:  [id number] Admit date:  02/23/2013  Clinical Social Worker:  Rozetta Nunnery, Theresia Majors  Date/time:  02/28/2013 03:19 PM  Clinical Social Work is seeking post-discharge placement for this patient at the following level of care:   SKILLED NURSING   (*CSW will update this form in Epic as items are completed)   02/28/2013  Patient/family provided with [facility] Department of Clinical Social Work's list of facilities offering this level of care within the geographic area requested by the patient (or if unable, by the patient's family).  02/28/2013  Patient/family informed of their freedom to choose among providers that offer the needed level of care, that participate in Medicare, Medicaid or managed care program needed by the patient, have an available bed and are willing to accept the patient.  02/28/2013  Patient/family informed of [facility]' ownership interest in [facility], as well as of the fact that they are under no obligation to receive care at this facility.  PASARR submitted to EDS on 02/28/2013 PASARR number received from EDS on 02/28/2013  FL2 transmitted to all facilities in geographic area requested by pt/family on  02/28/2013 FL2 transmitted to all facilities within larger geographic area on   Patient informed that his/her managed care company has contracts with or will negotiate with  certain facilities, including the following:     Patient/family informed of bed offers received:   Patient chooses bed at  Physician recommends and patient chooses bed at    Patient to be transferred to  on   Patient to be transferred to facility by   The following physician request were entered in Epic:   Additional Comments:  03-01-13 Patient is planned for [facility] residential hospice.

## 2013-02-28 NOTE — Progress Notes (Signed)
ANTICOAGULATION CONSULT NOTE - Initial Consult  Pharmacy Consult for warfarin  Indication: mechanical valve, hx afib, PE  Allergies  Allergen Reactions  . Cephalosporins     Noted on MAR  . Cinchona Extract     Noted on MAR  . Macrolides And Ketolides     Noted on MAR  . Methenamine     Noted on MAR  . Quinidine     Noted on MAR  . Salicylates     Noted on [facility]    Patient Measurements: Height: 5\' 8"  (172.7 cm) Weight: 192 lb 7.4 oz (87.3 kg) IBW/kg (Calculated) : 68.4  Vital Signs: Temp: 97.4 F (36.3 C) (03/13 0558) Temp src: Oral (03/13 0558) BP: 147/77 mmHg (03/13 0558) Pulse Rate: 62 (03/13 0558)  Labs:  Recent Labs  02/26/13 0430 02/27/13 0445 02/28/13 0610 02/28/13 0955  HGB 11.5* 11.9*  --   --   HCT 33.6* 34.4*  --   --   PLT 155 138*  --   --   LABPROT  --  27.0*  --  21.8*  INR  --  2.65*  --  1.99*  CREATININE 4.73* 5.05* 5.52*  --     Estimated Creatinine Clearance: 9.6 ml/min (by C-G formula based on Cr of 5.52).   Medical History: Past Medical History  Diagnosis Date  . Atrial fibrillation   . Diabetes mellitus without complication   . Hypertension   . Anemia   . Depression   . PVD (peripheral vascular disease)   . Carotid stenosis   . Gastritis   . Hypothyroidism   . Hyponatremia   . Hx of CABG   . PE (pulmonary embolism)     Medications:  Prescriptions prior to admission  Medication Sig Dispense Refill  . aspirin EC 81 MG tablet Take 81 mg by mouth every morning.      Marland Kitchen atorvastatin (LIPITOR) 80 MG tablet Take 80 mg by mouth every evening.      . docusate sodium (COLACE) 100 MG capsule Take 100 mg by mouth 2 (two) times daily.      . famotidine (PEPCID) 20 MG tablet Take 20 mg by mouth every morning.      . folic acid (FOLVITE) 1 MG tablet Take 2 mg by mouth every morning.      Marland Kitchen glipiZIDE (GLUCOTROL) 5 MG tablet Take 5 mg by mouth every morning.      . Iron-DSS-B12-FA-C-E-Cu-Biotin (HEMAX PO) Take 1 tablet by mouth 2 (two)  times daily.      . isosorbide mononitrate (IMDUR) 30 MG 24 hr tablet Take 30 mg by mouth every morning.      Marland Kitchen levothyroxine (SYNTHROID, LEVOTHROID) 112 MCG tablet Take 112 mcg by mouth every morning.      Marland Kitchen lisinopril (PRINIVIL,ZESTRIL) 10 MG tablet Take 10-20 mg by mouth 2 (two) times daily. 2 tablets (20mg ) in the morning 1 tablet (10mg ) in the evening      . metolazone (ZAROXOLYN) 5 MG tablet Take 5 mg by mouth every morning.      . moxifloxacin (AVELOX) 400 MG tablet Take 400 mg by mouth every morning. For 7 days Started 3/5      . Multiple Vitamin (MULTIVITAMIN WITH MINERALS) TABS Take 1 tablet by mouth daily.      . nebivolol (BYSTOLIC) 5 MG tablet Take 5 mg by mouth every morning.      . nitroGLYCERIN (NITROSTAT) 0.4 MG SL tablet Place 0.4 mg under the tongue every 5 (  five) minutes x 3 doses as needed for chest pain.      . Omega-3 Fatty Acids (SEA-OMEGA 30 PO) Take 1 capsule by mouth 2 (two) times daily.      . ranolazine (RANEXA) 500 MG 12 hr tablet Take 500 mg by mouth 2 (two) times daily.      Marland Kitchen torsemide (DEMADEX) 20 MG tablet Take 20-40 mg by mouth 2 (two) times daily. 2 tablets (40mg ) in the morning 1 tablet (20mg ) at [facility].      . traMADol (ULTRAM) 50 MG tablet Take 50 mg by mouth every 6 (six) hours as needed for pain.      Marland Kitchen warfarin (COUMADIN) 2 MG tablet Take 1-2 mg by mouth See admin instructions. Take 2mg  x2 days then 1mg  x1 day. Repeat this regimen every 3 days per nursing facility.        Assessment: 89 yom with history of mechanical valve, PE and afib was on coumadin PTA but it was reversed with vitamin K. Today is INR is subtherapeutic at 1.99. No bleeding noted. Pt is anemic with H/H of 11.9/34.4 and plts 138 as of 3/12. To resume coumadin tonight.   Goal of Therapy:  INR 2.5-3.5 Monitor platelets by anticoagulation protocol: Yes   Plan:  1. Warfarin 3mg  PO x 1 tonight 2. Daily INR  Rumbarger, Drake Leach 02/28/2013,2:32 PM

## 2013-02-28 NOTE — Progress Notes (Signed)
   Subjective:  Patient has remained stable overnight.  His renal function continues to deteriate. He is not having any CP.  Objective:  Vital Signs in the last 24 hours: Temp:  [96.9 F (36.1 C)-97.8 F (36.6 C)] 97.4 F (36.3 C) (03/13 0558) Pulse Rate:  [59-70] 62 (03/13 0558) Resp:  [20-24] 20 (03/13 0558) BP: (119-147)/(67-83) 147/77 mmHg (03/13 0558) SpO2:  [93 %-97 %] 95 % (03/13 0558)  Intake/Output from previous day: 03/12 0701 - 03/13 0700 In: 180 [I.V.:180] Out: 300 [Urine:300] Intake/Output from this shift:    . aspirin EC  81 mg Oral q morning - 10a  . docusate sodium  100 mg Oral BID  . famotidine  20 mg Oral q morning - 10a  . folic acid  2 mg Oral q morning - 10a  . hydrocortisone sodium succinate  50 mg Intravenous Q12H  . insulin aspart  0-9 Units Subcutaneous Q4H  . levothyroxine  112 mcg Oral QAC breakfast  . nebivolol  2.5 mg Oral q morning - 10a  . ranolazine  500 mg Oral BID      Physical Exam: The patient appears to be in no distress.  He is not breathing as well as he was yesterday  Head and neck exam :  normal  Chest - extensive rhonchi and a few wheezes this am   Heart reveals good mechanical aortic valve click, 2/6 systolic murmur, ? Soft diastolic murmur.  The abdomen is soft and nontender.  Hypoactive bowel sounds  Extremities reveal no phlebitis or edema.  Pedal pulses are good.  There is no cyanosis or clubbing.  Neuro: normal.    Lab Results:  Recent Labs  02/26/13 0430 02/27/13 0445  WBC 6.7 7.1  HGB 11.5* 11.9*  PLT 155 138*    Recent Labs  02/27/13 0445 02/28/13 0610  NA 134* 133*  K 4.3 4.7  CL 96 93*  CO2 17* 18*  GLUCOSE 118* 162*  BUN 141* 152*  CREATININE 5.05* 5.52*   No results found for this basename: TROPONINI, CK, MB,  in the last 72 hours Hepatic Function Panel No results found for this basename: PROT, ALBUMIN, AST, ALT, ALKPHOS, BILITOT, BILIDIR, IBILI,  in the last 72 hours No results found  for this basename: CHOL,  in the last 72 hours No results found for this basename: PROTIME,  in the last 72 hours  Imaging: No results found.  Cardiac Studies: Telemetry shows atrial fib with controlled ventricular response. Assessment/Plan:  1. Elevated Troponin-  Continue current conservative therapy.  He is not a candidate for cath 2. CAD s/p CABG x 1 s/p PCI about 4 years ago  3. Mechanical Aortic Valve (st Jude)  INR supratherapeutic. 4. AFIB with normal ventricular rate  5. Chronic Kidney Disease (stage 5, GFR 8) , creatinine continues to increase 6. NIDDM   Pharmacy is managing coumadin.  Would continue conservative management for his hx of CAD.  We are not planning on doing a cath  No new recs. Will sign off.  Call for questions.    LOS: 5 days    Elyn Aquas. 02/28/2013, 11:33 AM

## 2013-02-28 NOTE — Consult Note (Signed)
[facility] Liaison: Spoke with son Ebony Cargo by phone to make aware Mr. Capobianco is eligible for [facility]. Son is aware unfortunately no Catering manager today or tomorrow. Have made 6N CSW and RNCM aware. Will continue to follow, update family and staff with changes specific to availability for Mr. Okerlund. Thank you. Forrestine Him LCSW [phone]

## 2013-02-28 NOTE — Consult Note (Signed)
[facility] Liaison: [facility] room available for Dylan Salinas Friday 03/01/2013. Family aware and agreeable, have completed transfer paper work with son. Please fax discharge summary to [phone] and RN please call report to [phone]. Thank you. Forrestine Him LCSW [phone]

## 2013-02-28 NOTE — Progress Notes (Signed)
Name: Dylan Salinas MRN: [id number] DOB: [birth year]    LOS: 5  Referring Provider:  Dr. Manus Gunning Reason for Referral:  Hypotension  PULMONARY / CRITICAL CARE MEDICINE  BRIEF: Dylan Salinas is a pleasant 79 year old man with past medical history of coronary artery disease, congestive heart failure, atrial fibrillation, aortic valve replacement, diabetes mellitus, and pulmonary embolism admitted for unclear etiology shock.  Events Since Admission: 02/23/2013  3:23 PM > Admit. Central line placed, started on dopamine 3/8 ct head neg, bubbles in soft tissue from IV [place] abdo 3/8- Hepatic cirrhosis. No focal hepatic parenchymal abnormalities Moderate ascites. Cholelithiasis. Contracted gallbladder. No sonographic  evidence of acute cholecystitis.  Approximate 8 cm simple cyst arising from the lower pole of theleft kidney. 3/9 remains on dopmaine 3/11 dopa off  SUBJECTIVE/OVERNIGHT/INTERVAL HX Up to chair, very winded w exertion  Vital Signs: Temp:  [96.9 F (36.1 C)-97.5 F (36.4 C)] 97.4 F (36.3 C) (03/13 0558) Pulse Rate:  [59-70] 62 (03/13 0558) Resp:  [20-24] 20 (03/13 0558) BP: (119-147)/(70-83) 147/77 mmHg (03/13 0558) SpO2:  [93 %-97 %] 95 % (03/13 0558)  Intake/Output Summary (Last 24 hours) at 02/28/13 1354 Last data filed at 02/27/13 2200  Gross per 24 hour  Intake     60 ml  Output    100 ml  Net    -40 ml    Physical Examination: General: awake, alert,  Neuro: poorly oriented, gen weakness HEENT: jvd wnl PULM: coarse scattered rhonchi  CV: s1 s2 borderline brady GI: soft, bs wnl, no  Extremities: no edema  IMAGING x 48h No results found.   Principal Problem:   Hypotension Active Problems:   Hypoxia   CHF (congestive heart failure)   CKD (chronic kidney disease)   Shock circulatory   Acute on chronic renal failure   ASSESSMENT AND PLAN Hypotension/cardiogenic shock (resolved)  Elevated troponins  plans  - continue stress steroids for now, begin wean to off  over 5 d CAD s/p CABG X 1 and PCI 34 years ago   Afib  Mechanica aortic valve (st Jude)  P:  Will need to cont coumadin, See Heme   Hypoxic respiratory failure likely secondary to pulmonary edema -improved  P:  nasal oxygen, wean as tolerated  Defer diuresis at this time   Likely acute on chronic renal failure baseline creatinine unknown, ATN from shock likely Renal fxn worse, no voiding much. Not a candidate for HD.   Recent Labs Lab 02/23/13 1544 02/24/13 0426 02/25/13 0305 02/26/13 0430 02/27/13 0445 02/28/13 0610  NA 134* 135 132* 133* 134* 133*  K 4.3 4.3 4.7 4.5 4.3 4.7  CL 95* 97 95* 95* 96 93*  CO2 24 23 21 20  17* 18*  BUN 88* 90* 100* 119* 141* 152*  CREATININE 3.76* 3.86* 4.38* 4.73* 5.05* 5.52*  CALCIUM 8.6 8.6 8.1* 7.9* 8.0* 8.2*  MG  --  2.6*  --   --   --   --    Intake/Output     03/12 0701 - 03/13 0700 03/13 0701 - 03/14 0700   P.O.     I.V. (mL/kg) 180 (2.1)    IV Piggyback     Total Intake(mL/kg) 180 (2.1)    Urine (mL/kg/hr) 300 (0.1)    Total Output 300     Net -120          Urine Occurrence 1 x 2 x   P:   following UOP and BMP Keep euvolemic    Liver disease/mild transaminitis.  Reported history of recent diagnosis of hepatitis A not otherwise specified,  cirhosis on [place] of unclear etiolog  Recent Labs Lab 02/23/13 1544  AST 117*  ALT 58*  ALKPHOS 124*  BILITOT 2.6*  PROT 6.4  ALBUMIN 2.6*    Recent Labs Lab 02/23/13 1544 02/24/13 0426 02/25/13 0305 02/27/13 0445 02/28/13 0955  INR 3.91* 4.42* 4.47* 2.65* 1.99*  P:   Discussed that he has an acute injury, plus probable underlying chronic disease Continue diet pepcid Repeat LFT 3/12  Anticoagulated for history of atrial fibrillation and mechanical valve  Recent Labs Lab 02/23/13 1544 02/24/13 0426 02/25/13 0305 02/26/13 0430 02/27/13 0445 02/28/13 0955  HGB 11.9* 13.8 12.3* 11.5* 11.9*  --   HCT 35.5* 40.1 35.5* 33.6* 34.4*  --   PLT 151 159 159 155 138*  --    INR 3.91* 4.42* 4.47*  --  2.65* 1.99*   P:  Resume coumadin   Diabetes  Controlled on oral hypoglycemics  Recent Labs Lab 02/27/13 1147 02/27/13 1513 02/27/13 2145 02/28/13 0816 02/28/13 1132  GLUCAP 164* 200* 162* 137* 144*   P:   Hold orals and place patient on sliding scale insulin  A:  Altered mental status. Head CT in ER at admission negative for acute processes  P - Monitor    Working on transitioning to residential hospice. He is very awake and able to take pos. If his mentation declines we will convert to full palliative mode, but for now continue supportive meds. Will see what his creatinine looks like in am.

## 2013-02-28 NOTE — Clinical Social Work Note (Addendum)
Clinical Social Worker spoke with patient's son regarding disposition plans. CSW was informed by Dylan Salinas at [facility] that they did not currently have availability. CSW inquired with son about alternative facility and son had reservations with [facility] because that was where his mother passed. CSW explored option of SNF with hospice/palliative and son reported that he will have to discuss this with his sister. CSW will continue to follow and son will contact CSW this afternoon.   13:42pm CSW received voice message from son reporting that is agreeable for CSW to initiate referral to [facility].   14:48pm CSW spoke with son regarding availability at [facility] and at this time, they currently do not have availability. Son gave CSW permission to pursue SNF search with Hospice/Palliative to follow. Per son, they gave their two weeks notice at ALF, [facility].   Dylan Salinas MSW, [employer] [phone]

## 2013-03-01 LAB — BASIC METABOLIC PANEL
BUN: 161 mg/dL — ABNORMAL HIGH (ref 6–23)
CO2: 15 mEq/L — ABNORMAL LOW (ref 19–32)
Glucose, Bld: 116 mg/dL — ABNORMAL HIGH (ref 70–99)
Potassium: 4.7 mEq/L (ref 3.5–5.1)
Sodium: 135 mEq/L (ref 135–145)

## 2013-03-01 LAB — GLUCOSE, CAPILLARY
Glucose-Capillary: 105 mg/dL — ABNORMAL HIGH (ref 70–99)
Glucose-Capillary: 106 mg/dL — ABNORMAL HIGH (ref 70–99)

## 2013-03-01 MED ORDER — LORAZEPAM 1 MG PO TABS: 1.0000 mg | ORAL_TABLET | Freq: Three times a day (TID) | ORAL | Status: AC

## 2013-03-01 MED ORDER — WARFARIN - PHARMACIST DOSING INPATIENT: Freq: Every day | Status: DC

## 2013-03-01 MED ORDER — MORPHINE SULFATE (CONCENTRATE) 10 MG /0.5 ML PO SOLN: 10.0000 mg | ORAL | Status: AC | PRN

## 2013-03-01 MED ORDER — WARFARIN SODIUM 3 MG PO TABS
3.0000 mg | ORAL_TABLET | Freq: Once | ORAL | Status: DC
  Filled 2013-03-01: qty 1

## 2013-03-01 NOTE — Progress Notes (Signed)
stafff note. AGree with above. Patient was seen approx 10am 02/28/13 but signed only 03/01/2013 and 10:49 AM    Dr. Kalman Shan, M.D., [facility].C.P Pulmonary and Critical Care Medicine Staff Physician [facility] [facility] Pager: [phone], If no answer or between  15:00h - 7:00h: call [phone]  03/01/2013 10:49 AM

## 2013-03-01 NOTE — Clinical Social Work Note (Signed)
Clinical Child psychotherapist reviewed chart and received message from Forrestine Him, [employer], regarding availability today at [facility]. CSW will sign off, as social work intervention is no longer needed.   Rozetta Nunnery MSW, [employer] [phone]

## 2013-03-01 NOTE — Progress Notes (Signed)
ANTICOAGULATION CONSULT NOTE - Follow Up Consult  Pharmacy Consult for Coumadin Indication: atrial fibrillation, pulmonary embolus and mechanical valve  Allergies  Allergen Reactions  . Cephalosporins     Noted on MAR  . Cinchona Extract     Noted on MAR  . Macrolides And Ketolides     Noted on MAR  . Methenamine     Noted on MAR  . Quinidine     Noted on MAR  . Salicylates     Noted on [facility]    Patient Measurements: Height: 5\' 8"  (172.7 cm) Weight: 192 lb 0.3 oz (87.1 kg) IBW/kg (Calculated) : 68.4  Vital Signs: Temp: 98.1 F (36.7 C) (03/14 0551) BP: 117/73 mmHg (03/14 0551) Pulse Rate: 63 (03/14 0551)  Labs:  Recent Labs  02/27/13 0445 02/28/13 0610 02/28/13 0955 03/01/13 0555  HGB 11.9*  --   --   --   HCT 34.4*  --   --   --   PLT 138*  --   --   --   LABPROT 27.0*  --  21.8* 22.4*  INR 2.65*  --  1.99* 2.06*  CREATININE 5.05* 5.52*  --  5.62*    Estimated Creatinine Clearance: 9.4 ml/min (by C-G formula based on Cr of 5.62).   Medications:  Scheduled:  . aspirin EC  81 mg Oral q morning - 10a  . docusate sodium  100 mg Oral BID  . famotidine  20 mg Oral q morning - 10a  . folic acid  2 mg Oral q morning - 10a  . hydrocortisone sodium succinate  50 mg Intravenous Q12H  . insulin aspart  0-9 Units Subcutaneous Q4H  . levothyroxine  112 mcg Oral QAC breakfast  . nebivolol  2.5 mg Oral q morning - 10a  . ranolazine  500 mg Oral BID  . [COMPLETED] warfarin  3 mg Oral ONCE-1800  . Warfarin - Pharmacist Dosing Inpatient   Does not apply q1800   Assessment: 79 yo male on Coumadin PTA (2mg  x2 days then 1mg  x1 day, repeat every 3 days) for afib, PE and mechanical AVR. INR supratherapeutic on admission and received vitamin K 5mg  IV on 3/11.   INR 2.06 today. H/H 11.9/34.4 and plts 138 from CBC on 3/12. No signs of bleeding at this time.   Goal of Therapy:  INR 2.5-3.5 Monitor platelets by anticoagulation protocol: Yes   Plan:  Coumadin 3mg  x1  tonight again Daily PT/INR  **Pt is on ranolazine and pt has poor renal function. Plasma level can increase significantly with renal dysfunction but there is little guidance in how to adjust the dose. Consider holding ranolazine for now**.   Micheline Chapman PharmD Candidate 03/01/2013,9:46 AM

## 2013-03-01 NOTE — Discharge Summary (Signed)
Dr. Kalman Shan, M.D., [facility].C.P Pulmonary and Critical Care Medicine Staff Physician [facility] Pager: [phone], If no answer or between  15:00h - 7:00h: call [phone]  03/01/2013 5:39 PM

## 2013-03-01 NOTE — Discharge Planning (Signed)
Patient discharged to [facility] in stable condition.

## 2013-03-19 DEATH — deceased

## 2014-08-11 IMAGING — US US ABDOMEN COMPLETE
1 series · 13 of 25 positions shown · non-contrast
Comparison: None.

CLINICAL DATA: Recent diagnosis of hepatitis A, now with liver
failure.  Current history of hypertension, diabetes, and chronic
kidney disease.

COMPLETE ABDOMINAL ULTRASOUND

[Series 1: us abdomen complete · 0.32mm/px · 13 of 70 slices shown]
[im 1/70]
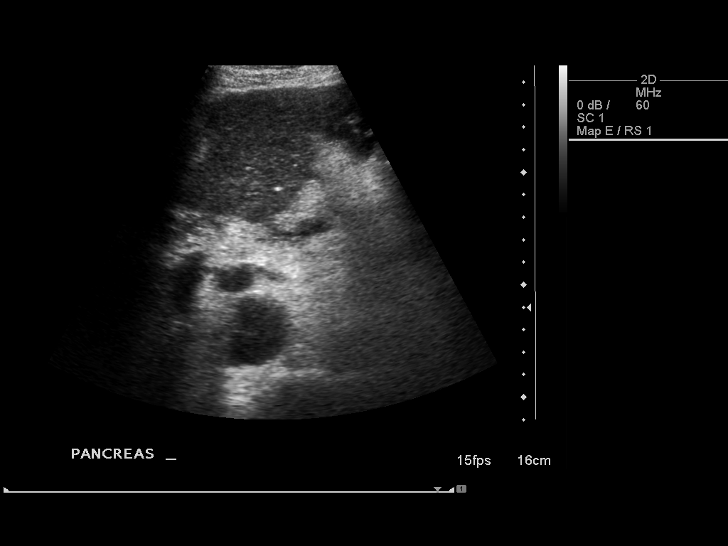
[im 6/70]
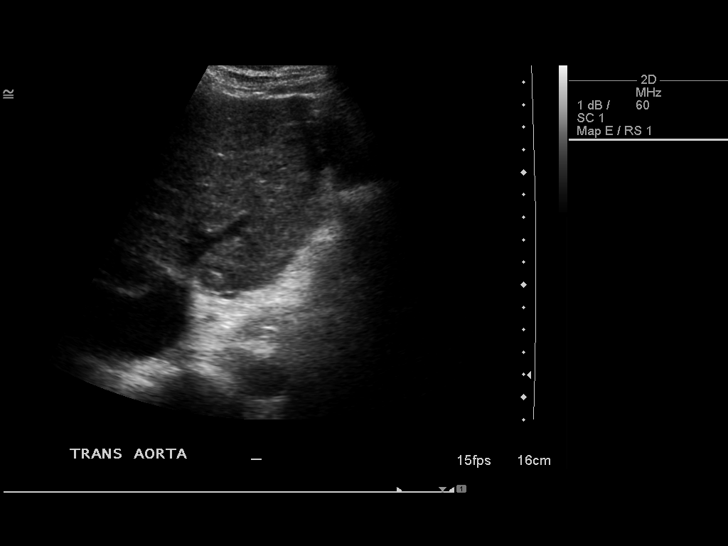
[im 12/70]
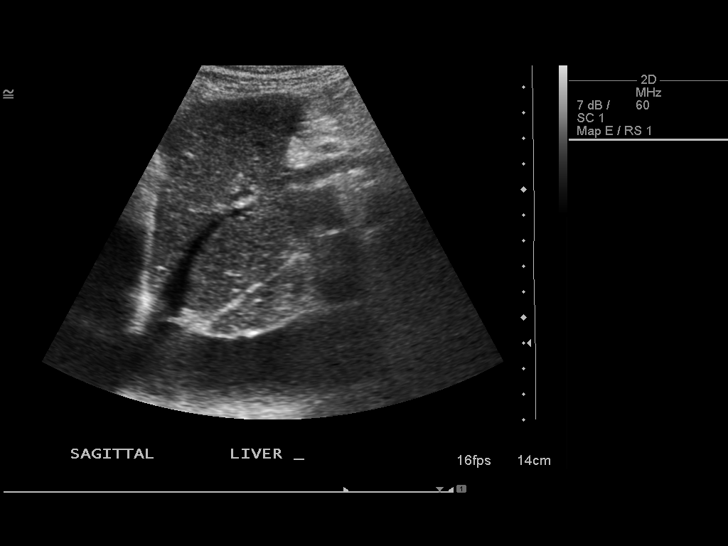
[im 18/70]
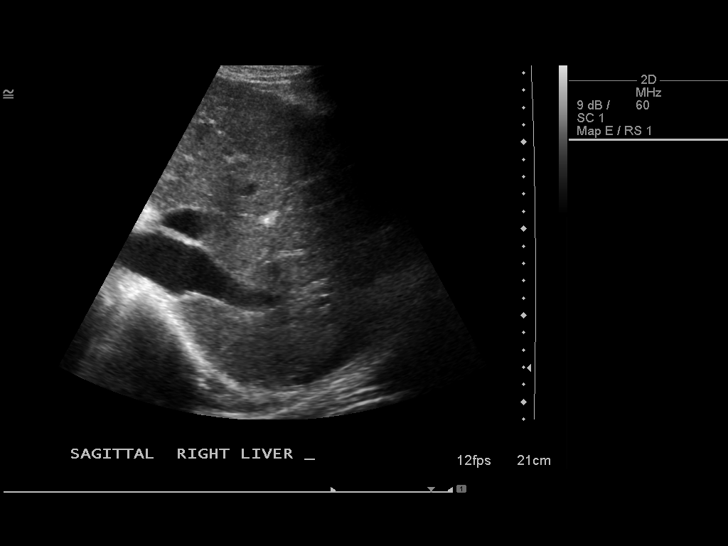
[im 24/70]
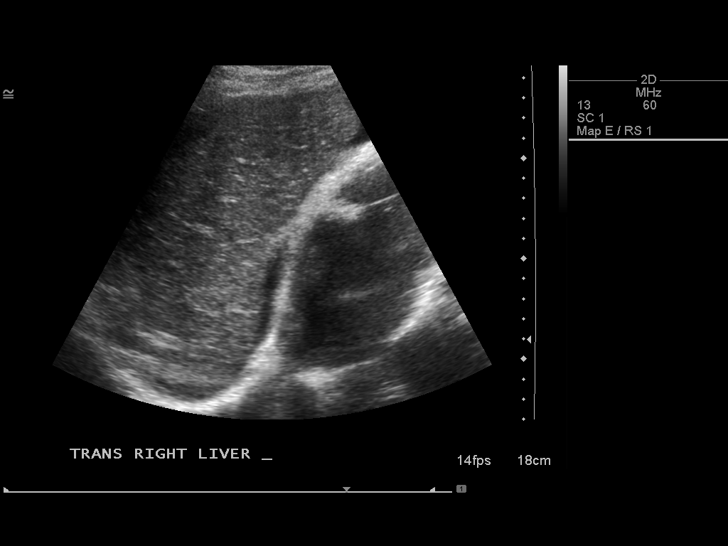
[im 29/70]
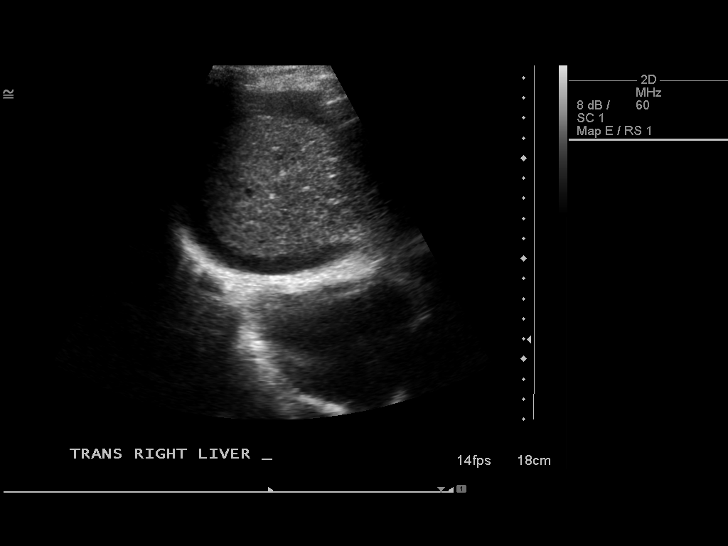
[im 35/70]
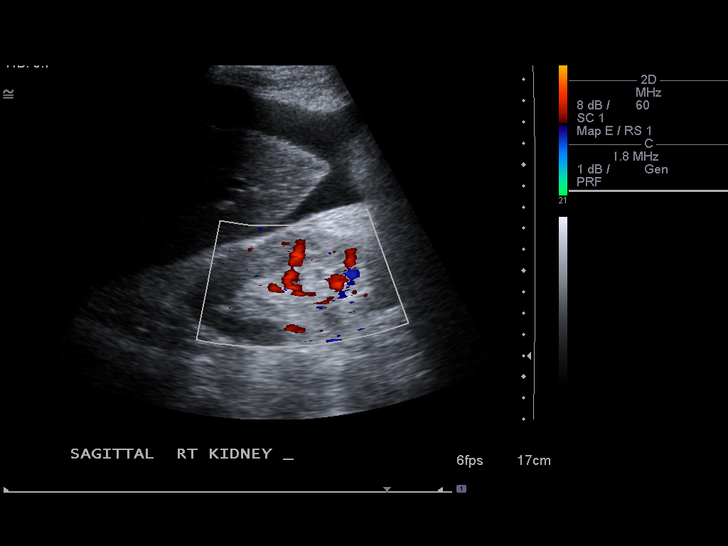
[im 41/70]
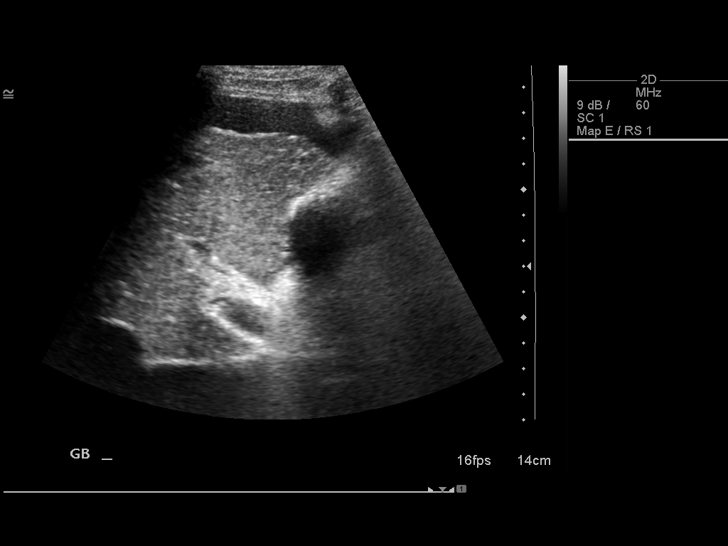
[im 47/70]
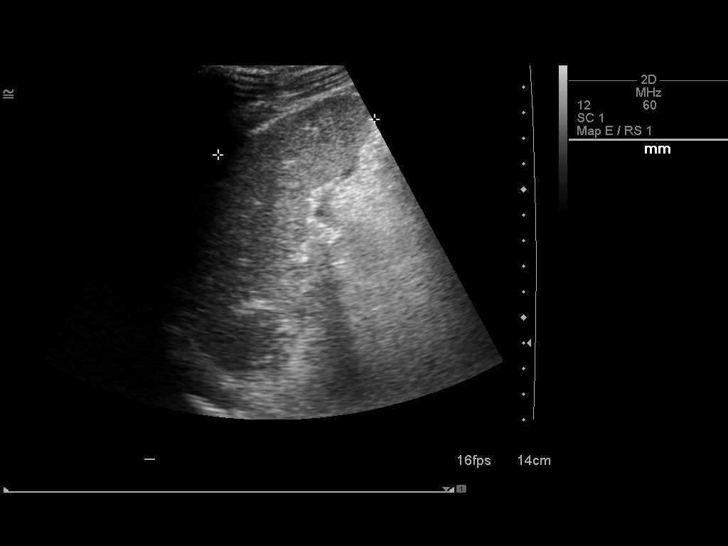
[im 52/70]
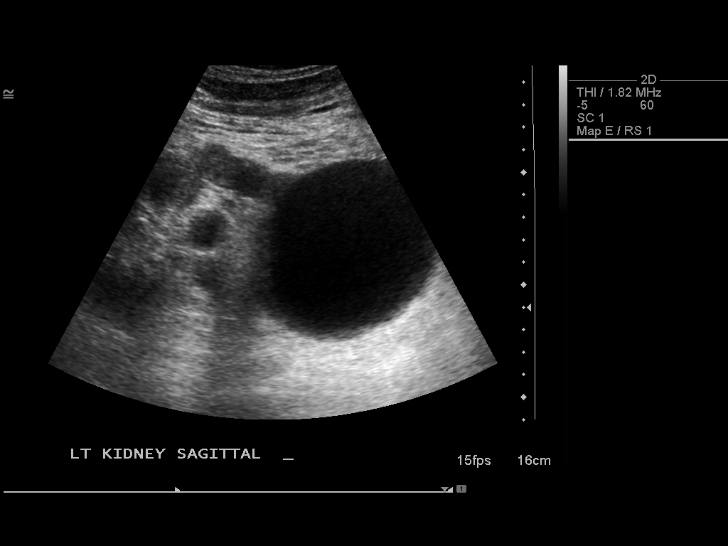
[im 58/70]
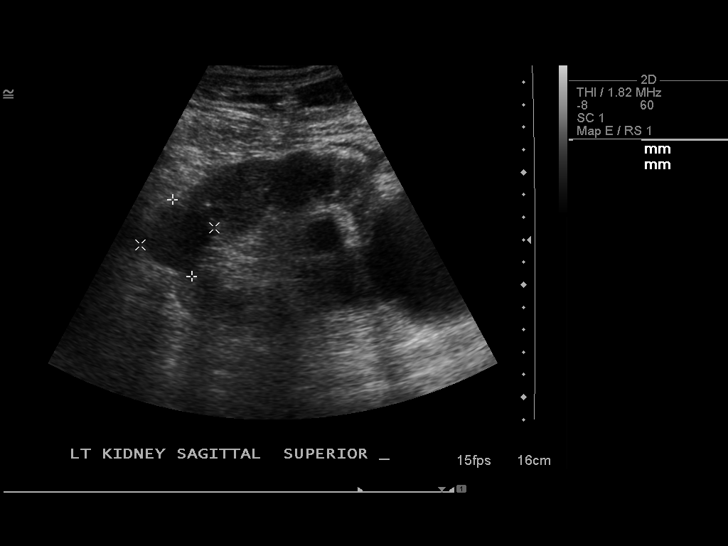
[im 64/70]
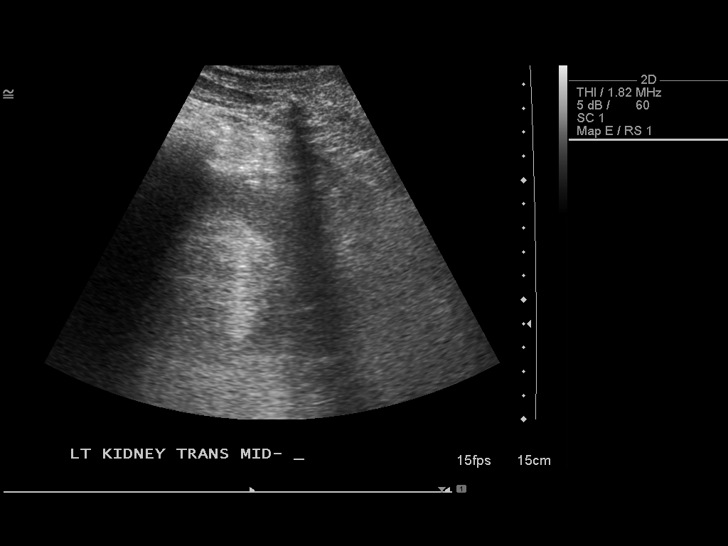
[im 70/70]
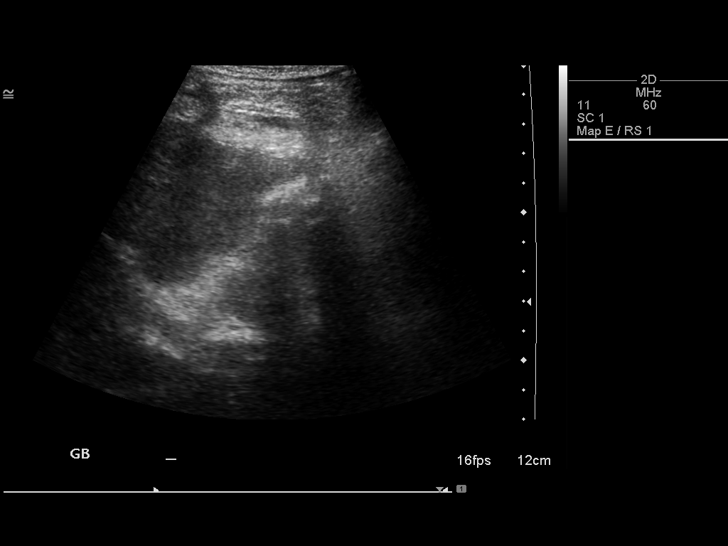

[13 of 25 positions shown; findings below may reference images not displayed]

FINDINGS: Gallbladder:  Contracted containing numerous shadowing gallstones.
Borderline gallbladder wall thickening at 3 mm.  No pericholecystic
fluid.  Negative sonographic Murphy's sign according to the
ultrasound technologist.

Common bile duct:  Normal in caliber with maximum diameter
approximating 3 mm.

Liver:  Contracted with irregular contour and coarse, echogenic
parenchyma.  No focal hepatic parenchymal abnormality.

IVC:  Patent.

Pancreas:  Echogenic consistent with fatty replacement.  No focal
parenchymal abnormality.

Spleen:  Normal in size and echotexture without focal parenchymal
abnormality.

Right Kidney:  Diffuse cortical thinning without focal parenchymal
abnormality.  Normal parenchymal echotexture.  No hydronephrosis.
No shadowing calculi.  Approximately 10.9 cm in length.

Left Kidney:  Diffuse cortical thinning.  Multiple cortical cysts,
the largest measuring approximately 8.2 x 8.2 x 8.4 cm arising from
the lower pole.  No solid parenchymal lesions.  No shadowing
calculi.  No hydronephrosis.  Approximately 11.8 cm in length.

Abdominal aorta:  Normal in caliber in its proximal and
midportions; obscured distally by overlying bowel gas.

Other findings:  Moderate upper abdominal ascites.
IMPRESSION: 1.  Hepatic cirrhosis.  No focal hepatic parenchymal abnormalities.
2.  Moderate ascites.
3.  Cholelithiasis.  Contracted gallbladder.  No sonographic
evidence of acute cholecystitis.
4.  Approximate 8 cm simple cyst arising from the lower pole of the
left kidney.

## 2014-08-13 IMAGING — CR DG CHEST 1V PORT
1 series · 1 of 1 positions shown · non-contrast
Comparison: Chest x-ray 02/24/2013.

CLINICAL DATA: Evaluate for pulmonary edema.

PORTABLE CHEST - 1 VIEW

[AP]
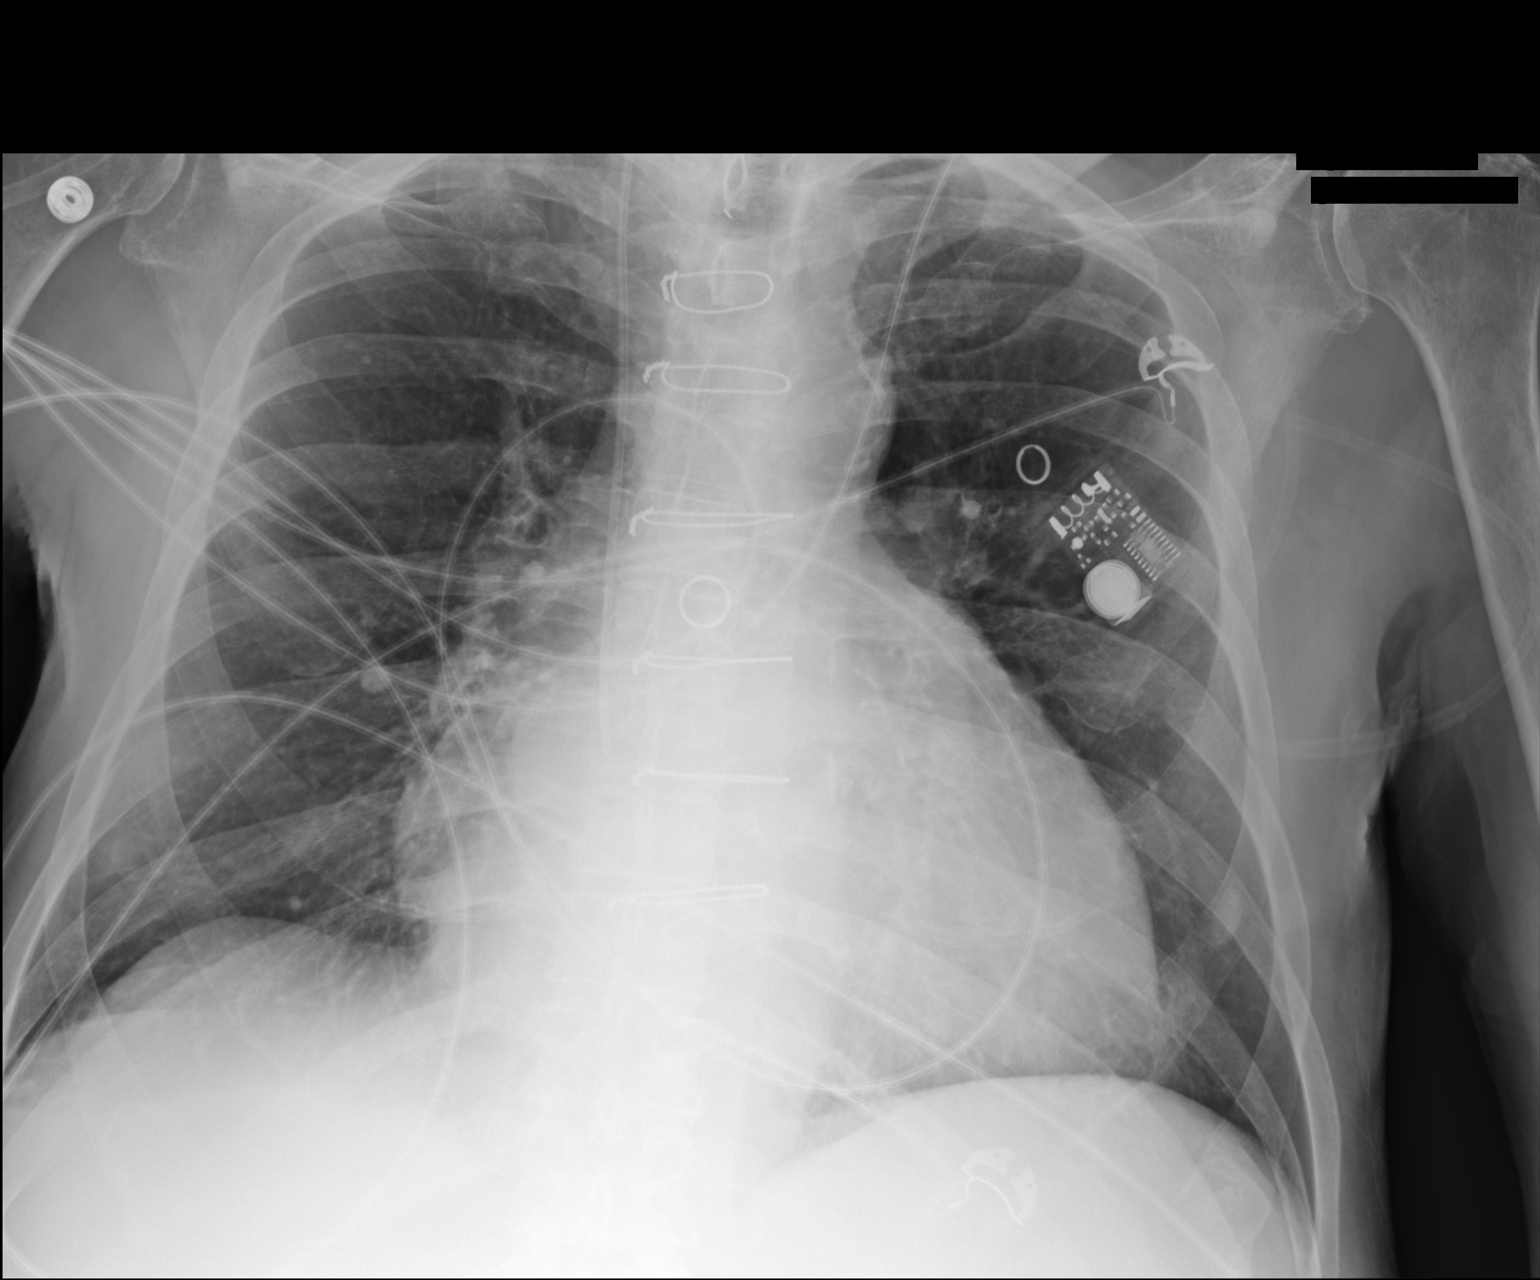

[1 of 1 positions shown; findings below may reference images not displayed]

FINDINGS: There is a right-sided internal jugular central venous
catheter with tip terminating in the superior aspect of the right
atrium (approximate 2.2 cm below the superior cavoatrial junction).
Status post median sternotomy for CABG.  Calcified granuloma in the
right mid lung.  No consolidative airspace disease.  No pleural
effusions.  Pulmonary vasculature is within normal limits.  Heart
size is mild to moderately enlarged.  The upper mediastinal
contours are within normal limits.  Atherosclerosis in the thoracic
aorta.  New electronic monitoring device projecting over the upper
left hemithorax..
IMPRESSION: 1.  Tip of right IJ catheter remains in the superior aspect of the
right atrium and could be withdrawn approximately 2.2 cm for more
optimal placement at the superior cavoatrial junction.
2.  Improving lung volumes without radiographic evidence of acute
cardiopulmonary disease on today's examination.
3.  Mild to moderate cardiomegaly redemonstrated.
4.  Atherosclerosis.

## 2022-03-22 ENCOUNTER — Telehealth: Payer: Self-pay | Admitting: *Deleted

## 2022-03-22 NOTE — Telephone Encounter (Signed)
Follow up call to recent hospitalization . Doing better was placed on po lasix and has loss about 10 pounds of fliud.  Doing daily wts and calling them in to the cardiologist. ?
